# Patient Record
Sex: Female | Born: 1935 | Race: Black or African American | Hispanic: No | Smoking: Never smoker
Health system: Southern US, Community
[De-identification: ages and names within clinical notes are randomized; demographics above are authoritative.]

## PROBLEM LIST (undated history)

## (undated) DIAGNOSIS — I509 Heart failure, unspecified: Secondary | ICD-10-CM

## (undated) DIAGNOSIS — I10 Essential (primary) hypertension: Secondary | ICD-10-CM

## (undated) DIAGNOSIS — E079 Disorder of thyroid, unspecified: Secondary | ICD-10-CM

## (undated) DIAGNOSIS — I639 Cerebral infarction, unspecified: Secondary | ICD-10-CM

---

## 2017-10-07 ENCOUNTER — Emergency Department (HOSPITAL_COMMUNITY): Payer: Medicare Other

## 2017-10-07 ENCOUNTER — Emergency Department (HOSPITAL_COMMUNITY)
Admission: EM | Admit: 2017-10-07 | Discharge: 2017-10-07 | Disposition: A | Discharge status: Home / Self Care | Payer: Medicare Other | Attending: Physician Assistant | Admitting: Physician Assistant

## 2017-10-07 ENCOUNTER — Encounter (HOSPITAL_COMMUNITY): Payer: Self-pay | Admitting: Emergency Medicine

## 2017-10-07 DIAGNOSIS — I509 Heart failure, unspecified: Secondary | ICD-10-CM | POA: Diagnosis not present

## 2017-10-07 DIAGNOSIS — I110 Hypertensive heart disease with heart failure: Secondary | ICD-10-CM | POA: Diagnosis not present

## 2017-10-07 DIAGNOSIS — R404 Transient alteration of awareness: Secondary | ICD-10-CM | POA: Diagnosis not present

## 2017-10-07 DIAGNOSIS — R4182 Altered mental status, unspecified: Secondary | ICD-10-CM | POA: Diagnosis present

## 2017-10-07 HISTORY — DX: Heart failure, unspecified: I50.9

## 2017-10-07 HISTORY — DX: Disorder of thyroid, unspecified: E07.9

## 2017-10-07 HISTORY — DX: Essential (primary) hypertension: I10

## 2017-10-07 LAB — CBC WITH DIFFERENTIAL/PLATELET
Basophils Absolute: 0 10*3/uL (ref 0.0–0.1)
Basophils Relative: 0 %
EOS PCT: 5 %
Eosinophils Absolute: 0.3 10*3/uL (ref 0.0–0.7)
HEMATOCRIT: 37.6 % (ref 36.0–46.0)
HEMOGLOBIN: 11.5 g/dL — AB (ref 12.0–15.0)
LYMPHS ABS: 1.4 10*3/uL (ref 0.7–4.0)
LYMPHS PCT: 26 %
MCH: 27.7 pg (ref 26.0–34.0)
MCHC: 30.6 g/dL (ref 30.0–36.0)
MCV: 90.6 fL (ref 78.0–100.0)
Monocytes Absolute: 0.8 10*3/uL (ref 0.1–1.0)
Monocytes Relative: 16 %
Neutro Abs: 2.9 10*3/uL (ref 1.7–7.7)
Neutrophils Relative %: 53 %
Platelets: 293 10*3/uL (ref 150–400)
RBC: 4.15 MIL/uL (ref 3.87–5.11)
RDW: 16.2 % — ABNORMAL HIGH (ref 11.5–15.5)
WBC: 5.4 10*3/uL (ref 4.0–10.5)

## 2017-10-07 LAB — COMPREHENSIVE METABOLIC PANEL
ALBUMIN: 3 g/dL — AB (ref 3.5–5.0)
ALT: 7 U/L — AB (ref 14–54)
AST: 19 U/L (ref 15–41)
Alkaline Phosphatase: 35 U/L — ABNORMAL LOW (ref 38–126)
Anion gap: 13 (ref 5–15)
BUN: 14 mg/dL (ref 6–20)
CO2: 26 mmol/L (ref 22–32)
CREATININE: 0.92 mg/dL (ref 0.44–1.00)
Calcium: 8 mg/dL — ABNORMAL LOW (ref 8.9–10.3)
Chloride: 98 mmol/L — ABNORMAL LOW (ref 101–111)
GFR calc non Af Amer: 54 mL/min — ABNORMAL LOW (ref >60)
Glucose, Bld: 134 mg/dL — ABNORMAL HIGH (ref 65–99)
Potassium: 3.6 mmol/L (ref 3.5–5.1)
SODIUM: 137 mmol/L (ref 135–145)
Total Bilirubin: 0.6 mg/dL (ref 0.3–1.2)
Total Protein: 7.7 g/dL (ref 6.5–8.1)

## 2017-10-07 LAB — URINALYSIS, ROUTINE W REFLEX MICROSCOPIC
BILIRUBIN URINE: NEGATIVE
Glucose, UA: NEGATIVE mg/dL
Hgb urine dipstick: NEGATIVE
Ketones, ur: NEGATIVE mg/dL
Leukocytes, UA: NEGATIVE
NITRITE: NEGATIVE
PH: 6 (ref 5.0–8.0)
Protein, ur: NEGATIVE mg/dL
SPECIFIC GRAVITY, URINE: 1.008 (ref 1.005–1.030)

## 2017-10-07 LAB — AMMONIA: Ammonia: 21 umol/L (ref 9–35)

## 2017-10-07 NOTE — Discharge Instructions (Signed)
Get help right away if: °You develop severe headaches, repeated vomiting, seizures, blackouts, or slurred speech. °There is increasing confusion, weakness, numbness, restlessness, or personality changes. °You develop a loss of balance, have marked dizziness, feel uncoordinated, or fall. °You have delusions, hallucinations, or develop severe anxiety. °Your family members think you need to be rechecked. °

## 2017-10-07 NOTE — ED Provider Notes (Signed)
[facility] EMERGENCY DEPARTMENT Provider Note   CSN: [id number] Arrival date & time: 10/07/17  0906     History   Chief Complaint Chief Complaint  Patient presents with  . Altered Mental Status    HPI Tina Waters is a 84 y.o. female coming in from [facility] for AMS. Around 5:00 am patient was difficult to arouse for staff. She was thought to have a R sided facial droop. Fire found her 02 sat at 90%. Normal cbg and vitals otherwise.  EMS reports that she did seem to have a R facial droop, and required a sternal rub to awaken, however she awoke immediately and had an equal and symmetric facial muscle test. She has been awake and at baseline since she had a asternal rub. Oriented to Person and place (baseline MS). She has no complaints and no recent illnesses.  HPI  Past Medical History:  Diagnosis Date  . CHF (congestive heart failure) (HCC)   . Hypertension   . Thyroid disease     There are no active problems to display for this patient.   History reviewed. No pertinent surgical history.  OB History    No data available       Home Medications    Prior to Admission medications   Not on File    Family History History reviewed. No pertinent family history.  Social History Social History   Tobacco Use  . Smoking status: Never Smoker  . Smokeless tobacco: Never Used  Substance Use Topics  . Alcohol use: No    Frequency: Never  . Drug use: No     Allergies   Patient has no allergy information on record.   Review of Systems Review of Systems  Ten systems reviewed and are negative for acute change, except as noted in the HPI.   Physical Exam Updated Vital Signs BP 126/62   Pulse (!) 59   Temp 98.4 F (36.9 C) (Oral)   Resp 15   SpO2 93%   Physical Exam  Constitutional: She is oriented to person, place, and time. She appears well-developed and well-nourished. No distress.  HENT:  Head: Normocephalic and atraumatic.    Eyes: Conjunctivae are normal. No scleral icterus.  Neck: Normal range of motion.  Cardiovascular: Normal rate, regular rhythm and normal heart sounds. Exam reveals no gallop and no friction rub.  No murmur heard. Pulmonary/Chest: Effort normal and breath sounds normal. No respiratory distress.  Abdominal: Soft. Bowel sounds are normal. She exhibits no distension and no mass. There is no tenderness. There is no guarding.  Musculoskeletal: She exhibits edema.  BL lower extremities  Neurological: She is alert and oriented to person, place, and time. She displays normal reflexes. No cranial nerve deficit. She exhibits normal muscle tone. Coordination normal.  Speech is clear and goal oriented, follows commands Major Cranial nerves without deficit, no facial droop Normal strength in upper and lower extremities bilaterally including dorsiflexion and plantar flexion, strong and equal grip strength Sensation normal to light and sharp touch Moves extremities without ataxia, coordination intact Normal finger to nose and rapid alternating movements Neg romberg, no pronator drift            Skin: Skin is warm and dry. She is not diaphoretic.  Psychiatric: Her behavior is normal.  Nursing note and vitals reviewed.    ED Treatments / Results  Labs (all labs ordered are listed, but only abnormal results are displayed) Labs Reviewed  COMPREHENSIVE METABOLIC PANEL -  Abnormal; Notable for the following components:      Result Value   Chloride 98 (*)    Glucose, Bld 134 (*)    Calcium 8.0 (*)    Albumin 3.0 (*)    ALT 7 (*)    Alkaline Phosphatase 35 (*)    GFR calc non Af Amer 54 (*)    All other components within normal limits  CBC WITH DIFFERENTIAL/PLATELET - Abnormal; Notable for the following components:   Hemoglobin 11.5 (*)    RDW 16.2 (*)    All other components within normal limits  URINALYSIS, ROUTINE W REFLEX MICROSCOPIC  AMMONIA  CBC WITH DIFFERENTIAL/PLATELET  POC URINE  PREG, ED  CBG MONITORING, ED    EKG  EKG Interpretation  Date/Time:  Friday October 07 2017 10:21:00 EST Ventricular Rate:  60 PR Interval:    QRS Duration: 134 QT Interval:  461 QTC Calculation: 461 R Axis:   -73 Text Interpretation:  Age not entered, assumed to be  84 years old for purpose of ECG interpretation Atrial-paced complexes Nonspecific IVCD with LAD LVH with secondary repolarization abnormality Anterior Q waves, possibly due to LVH paced rhtyhm note. Confirmed by Bary Castilla ([id number]) on 10/07/2017 10:25:34 AM       Radiology Dg Chest 2 View  Result Date: 10/07/2017 CLINICAL DATA:  Altered mental status, CHF, hypertension EXAM: CHEST  2 VIEW COMPARISON:  None available FINDINGS: Marked cardiomegaly with mild central vascular congestion. Negative for CHF or significant edema. No large effusion or pneumothorax. Bibasilar atelectasis, worse on the left. Trachea is midline. No pneumothorax. Aorta is atherosclerotic. Degenerative changes of the spine and shoulders. Chronic deformity and postop changes of the right humeral head. IMPRESSION: Marked cardiomegaly with vascular congestion and basilar atelectasis. Negative for CHF Electronically Signed   By: Judie Petit.  Shick M.D.   On: 10/07/2017 10:06   Ct Head Wo Contrast  Result Date: 10/07/2017 CLINICAL DATA:  Altered level of consciousness EXAM: CT HEAD WITHOUT CONTRAST TECHNIQUE: Contiguous axial images were obtained from the base of the skull through the vertex without intravenous contrast. COMPARISON:  None. FINDINGS: Brain: No evidence of acute infarction, hemorrhage, extra-axial collection, ventriculomegaly, or mass effect. Generalized cerebral atrophy. Periventricular white matter low attenuation likely secondary to microangiopathy. Vascular: Cerebrovascular atherosclerotic calcifications are noted. Skull: Negative for fracture or focal lesion. Sinuses/Orbits: Visualized portions of the orbits are unremarkable. Mastoid sinuses are  clear. Right maxillary sinus mucous retention cyst. Mild bilateral maxillary sinus mucosal thickening. Right sphenoid sinus mucosal thickening. Other: None. IMPRESSION: 1. No acute intracranial pathology. Electronically Signed   By: Elige Ko   On: 10/07/2017 13:21    Procedures Procedures (including critical care time)  Medications Ordered in ED Medications - No data to display   Initial Impression / Assessment and Plan / ED Course  I have reviewed the triage vital signs and the nursing notes.  Pertinent labs & imaging results that were available during my care of the patient were reviewed by me and considered in my medical decision making (see chart for details).     I reviewed the patient's labs, EKG, chest x-ray and CT scan.  Her family member who is present states that she does tend  to sleep very heavily and has had this issue in the past.  No evidence of TIA or abnormal neurologic function.  Should not has been seen and shared visit with Dr. Corlis Leak.  She appears appropriate for discharge at this time.  Final Clinical Impressions(s) / ED  Diagnoses   Final diagnoses:  Transient alteration of awareness    ED Discharge Orders    None       Arthor Captain, PA-C 10/07/17 1620    Abelino Derrick, MD 10/12/17 [phone]

## 2017-10-07 NOTE — ED Notes (Signed)
Pt leaving department with [facility]. NAD.  

## 2017-10-07 NOTE — ED Triage Notes (Signed)
Pt to ER from [facility] - staff activated EMS for patient being unresponsive, EMS states patient required vigorous stimulation to wake up, was grimacing to pain and that was it, once woke up staff reported patient was at her baseline. EMS reports initially, patient had right sided facial droop, but once asked to smile the droop was no longer appreciable. Pt is alert and oriented to baseline. No neuro deficits on arrival.

## 2018-01-10 ENCOUNTER — Emergency Department (HOSPITAL_COMMUNITY): Payer: Medicare Other

## 2018-01-10 ENCOUNTER — Encounter (HOSPITAL_COMMUNITY): Payer: Self-pay

## 2018-01-10 ENCOUNTER — Emergency Department (HOSPITAL_COMMUNITY)
Admission: EM | Admit: 2018-01-10 | Discharge: 2018-01-11 | Disposition: A | Discharge status: Home / Self Care | Payer: Medicare Other | Attending: Emergency Medicine | Admitting: Emergency Medicine

## 2018-01-10 DIAGNOSIS — Z79899 Other long term (current) drug therapy: Secondary | ICD-10-CM | POA: Diagnosis not present

## 2018-01-10 DIAGNOSIS — R109 Unspecified abdominal pain: Secondary | ICD-10-CM | POA: Insufficient documentation

## 2018-01-10 DIAGNOSIS — R5383 Other fatigue: Secondary | ICD-10-CM | POA: Insufficient documentation

## 2018-01-10 DIAGNOSIS — I110 Hypertensive heart disease with heart failure: Secondary | ICD-10-CM | POA: Diagnosis not present

## 2018-01-10 DIAGNOSIS — Z7982 Long term (current) use of aspirin: Secondary | ICD-10-CM | POA: Insufficient documentation

## 2018-01-10 DIAGNOSIS — I509 Heart failure, unspecified: Secondary | ICD-10-CM | POA: Insufficient documentation

## 2018-01-10 DIAGNOSIS — R197 Diarrhea, unspecified: Secondary | ICD-10-CM | POA: Diagnosis not present

## 2018-01-10 LAB — COMPREHENSIVE METABOLIC PANEL
ALT: 7 U/L — ABNORMAL LOW (ref 14–54)
ANION GAP: 11 (ref 5–15)
AST: 17 U/L (ref 15–41)
Albumin: 3.6 g/dL (ref 3.5–5.0)
Alkaline Phosphatase: 32 U/L — ABNORMAL LOW (ref 38–126)
BILIRUBIN TOTAL: 0.7 mg/dL (ref 0.3–1.2)
BUN: 36 mg/dL — ABNORMAL HIGH (ref 6–20)
CHLORIDE: 101 mmol/L (ref 101–111)
CO2: 27 mmol/L (ref 22–32)
Calcium: 8.6 mg/dL — ABNORMAL LOW (ref 8.9–10.3)
Creatinine, Ser: 1 mg/dL (ref 0.44–1.00)
GFR calc Af Amer: 57 mL/min — ABNORMAL LOW (ref >60)
GFR calc non Af Amer: 49 mL/min — ABNORMAL LOW (ref >60)
GLUCOSE: 109 mg/dL — AB (ref 65–99)
POTASSIUM: 4.3 mmol/L (ref 3.5–5.1)
Sodium: 139 mmol/L (ref 135–145)
TOTAL PROTEIN: 8.2 g/dL — AB (ref 6.5–8.1)

## 2018-01-10 LAB — TROPONIN I
Troponin I: 0.03 ng/mL (ref <0.03)
Troponin I: 0.03 ng/mL (ref <0.03)

## 2018-01-10 LAB — URINALYSIS, ROUTINE W REFLEX MICROSCOPIC
Bilirubin Urine: NEGATIVE
Glucose, UA: NEGATIVE mg/dL
Hgb urine dipstick: NEGATIVE
Ketones, ur: NEGATIVE mg/dL
LEUKOCYTES UA: NEGATIVE
Nitrite: NEGATIVE
PH: 6 (ref 5.0–8.0)
Protein, ur: NEGATIVE mg/dL
SPECIFIC GRAVITY, URINE: 1.01 (ref 1.005–1.030)

## 2018-01-10 LAB — CBC WITH DIFFERENTIAL/PLATELET
Basophils Absolute: 0 10*3/uL (ref 0.0–0.1)
Basophils Relative: 0 %
Eosinophils Absolute: 0.2 10*3/uL (ref 0.0–0.7)
Eosinophils Relative: 3 %
HEMATOCRIT: 36.1 % (ref 36.0–46.0)
Hemoglobin: 11.2 g/dL — ABNORMAL LOW (ref 12.0–15.0)
LYMPHS ABS: 2.6 10*3/uL (ref 0.7–4.0)
LYMPHS PCT: 43 %
MCH: 28 pg (ref 26.0–34.0)
MCHC: 31 g/dL (ref 30.0–36.0)
MCV: 90.3 fL (ref 78.0–100.0)
Monocytes Absolute: 0.6 10*3/uL (ref 0.1–1.0)
Monocytes Relative: 9 %
NEUTROS ABS: 2.7 10*3/uL (ref 1.7–7.7)
Neutrophils Relative %: 45 %
Platelets: 288 10*3/uL (ref 150–400)
RBC: 4 MIL/uL (ref 3.87–5.11)
RDW: 16.9 % — ABNORMAL HIGH (ref 11.5–15.5)
WBC: 6.1 10*3/uL (ref 4.0–10.5)

## 2018-01-10 LAB — CBG MONITORING, ED: Glucose-Capillary: 99 mg/dL (ref 65–99)

## 2018-01-10 NOTE — ED Provider Notes (Signed)
[facility]-EMERGENCY DEPT Provider Note   CSN: [id number] Arrival date & time: 01/10/18  1750     History   Chief Complaint Chief Complaint  Patient presents with  . Fatigue    HPI Tina Waters is a 84 y.o. female.  HPI  84 year old female presents requesting a "checkup".  She states that she has been feeling weak and fatigued for a couple weeks and thought she needed to get checked out.  She states she had diarrhea and some abdominal pain last week but that has resolved.  She had a little bit of dysuria but thinks that is also gone.  Besides feeling overall weak, she denies any new or other symptoms.  She denies headache, chest pain, shortness of breath.  Past Medical History:  Diagnosis Date  . CHF (congestive heart failure) (HCC)   . Hypertension   . Thyroid disease     There are no active problems to display for this patient.   History reviewed. No pertinent surgical history.   OB History   None      Home Medications    Prior to Admission medications   Medication Sig Start Date End Date Taking? Authorizing Provider  acetaminophen (TYLENOL) 500 MG tablet Take 500 mg by mouth every 8 (eight) hours as needed for moderate pain.   Yes [provider]  aspirin EC 81 MG tablet Take 81 mg by mouth daily.   Yes [provider]  atorvastatin (LIPITOR) 20 MG tablet Take 20 mg by mouth at bedtime.   Yes [provider]  carvedilol (COREG) 12.5 MG tablet Take 12.5 mg by mouth 2 (two) times daily.   Yes [provider]  Cholecalciferol (VITAMIN D3) 5000 units TABS Take 5,000 Units by mouth every 30 (thirty) days.   Yes [provider]  cinacalcet (SENSIPAR) 30 MG tablet Take 30 mg by mouth 2 (two) times daily.   Yes [provider]  colchicine 0.6 MG tablet Take 0.6 mg by mouth every 12 (twelve) hours.   Yes [provider]  cyclobenzaprine (FLEXERIL) 5 MG tablet Take 5 mg by mouth every 6 (six)  hours as needed (shoulder pain).   Yes [provider]  furosemide (LASIX) 40 MG tablet Take 40 mg by mouth 2 (two) times daily.   Yes [provider]  gabapentin (NEURONTIN) 300 MG capsule Take 300 mg by mouth 2 (two) times daily.   Yes [provider]  gabapentin (NEURONTIN) 400 MG capsule Take 400 mg by mouth at bedtime.   Yes [provider]  Infant Care Products (DERMACLOUD) CREA Apply 1 application topically 3 (three) times daily. Apply to sacrum and buttocks topically every shift to protect skin from breakdown   Yes [provider]  insulin aspart (NOVOLOG) 100 UNIT/ML injection Inject 2-10 Units into the skin 4 (four) times daily - after meals and at bedtime. Per sliding scale if BS 151-200 = 2 units, 201-250 = 4 units, 251-300 = 6 units, 301-350 = 8 units, 351-400 = 10 units   Yes [provider]  isosorbide mononitrate (IMDUR) 30 MG 24 hr tablet Take 30 mg by mouth daily.   Yes [provider]  levothyroxine (SYNTHROID, LEVOTHROID) 25 MCG tablet Take 12.5 mcg by mouth daily.   Yes [provider]  losartan (COZAAR) 100 MG tablet Take 100 mg by mouth daily.   Yes [provider]  metFORMIN (GLUCOPHAGE) 500 MG tablet Take 500 mg by mouth 2 (two) times  daily.   Yes [provider]  nitroGLYCERIN (NITROSTAT) 0.4 MG SL tablet Place 0.4 mg under the tongue every 5 (five) minutes as needed for chest pain. For up to 3 doses. If no relief, call MD   Yes [provider]  spironolactone (ALDACTONE) 25 MG tablet Take 12.5 mg by mouth 2 (two) times daily.   Yes [provider]  tiZANidine (ZANAFLEX) 2 MG tablet Take 2 mg by mouth every 8 (eight) hours as needed (shoulder pain).   Yes [provider]  venlafaxine XR (EFFEXOR-XR) 75 MG 24 hr capsule Take 75 mg by mouth daily with breakfast.   Yes [provider]    Family History History reviewed. No pertinent family  history.  Social History Social History   Tobacco Use  . Smoking status: Never Smoker  . Smokeless tobacco: Never Used  Substance Use Topics  . Alcohol use: No    Frequency: Never  . Drug use: No     Allergies   Penicillins   Review of Systems Review of Systems  Constitutional: Positive for fatigue. Negative for fever.  Respiratory: Negative for shortness of breath.   Cardiovascular: Negative for chest pain.  Gastrointestinal: Negative for abdominal pain, diarrhea and vomiting.  Genitourinary: Negative for dysuria.  Neurological: Negative for weakness, numbness and headaches.  All other systems reviewed and are negative.    Physical Exam Updated Vital Signs BP (!) 109/59   Pulse (!) 59   Temp 98.3 F (36.8 C) (Oral)   Resp 18   SpO2 94%   Physical Exam  Constitutional: She appears well-developed and well-nourished. No distress.  HENT:  Head: Normocephalic and atraumatic.  Right Ear: External ear normal.  Left Ear: External ear normal.  Nose: Nose normal.  Eyes: Right eye exhibits no discharge. Left eye exhibits no discharge.  Cardiovascular: Normal rate, regular rhythm and normal heart sounds.  Pulmonary/Chest: Effort normal and breath sounds normal.  Abdominal: Soft. There is no tenderness.  Neurological: She is alert.  Awake, alert, oriented to person, place, day of week, month and can't quite remember year. Knows the president. CN 3-12 grossly intact. 5/5 strength in all 4 extremities. Grossly normal sensation. Normal finger to nose.   Skin: Skin is warm and dry. She is not diaphoretic.  Nursing note and vitals reviewed.    ED Treatments / Results  Labs (all labs ordered are listed, but only abnormal results are displayed) Labs Reviewed  COMPREHENSIVE METABOLIC PANEL - Abnormal; Notable for the following components:      Result Value   Glucose, Bld 109 (*)    BUN 36 (*)    Calcium 8.6 (*)    Total Protein 8.2 (*)    ALT 7 (*)    Alkaline  Phosphatase 32 (*)    GFR calc non Af Amer 49 (*)    GFR calc Af Amer 57 (*)    All other components within normal limits  CBC WITH DIFFERENTIAL/PLATELET - Abnormal; Notable for the following components:   Hemoglobin 11.2 (*)    RDW 16.9 (*)    All other components within normal limits  TROPONIN I - Abnormal; Notable for the following components:   Troponin I 0.03 (*)    All other components within normal limits  TROPONIN I - Abnormal; Notable for the following components:   Troponin I 0.03 (*)    All other components within normal limits  URINALYSIS, ROUTINE W REFLEX MICROSCOPIC  CBG MONITORING, ED    EKG  EKG Interpretation  Date/Time:  Tuesday January 10 2018 19:04:32 EDT Ventricular Rate:  60 PR Interval:    QRS Duration: 137 QT Interval:  463 QTC Calculation: 463 R Axis:   -70 Text Interpretation:  Atrial-paced rhythm RBBB and LAFB Probable left ventricular hypertrophy Nonspecific T abnormalities, lateral leads no significant change since Mar 2019 Confirmed by Pricilla Loveless [phone]) on 01/10/2018 7:49:38 PM   Radiology Dg Chest 2 View  Result Date: 01/10/2018 CLINICAL DATA:  Weakness and diarrhea for 2 weeks. EXAM: CHEST - 2 VIEW COMPARISON:  None. FINDINGS: Moderate cardiomegaly. Dual lead transvenous pacemaker. Opacity is seen in the medial left lung base, suspicious for hiatal hernia, however mass, pneumonia, or thoracic aortic aneurysm cannot definitely be excluded. No evidence of pleural effusion. IMPRESSION: Opacity in medial left lung base is suspicious for hiatal hernia, although mass pneumonia, or thoracic aortic aneurysm cannot definitely be excluded. Recommend chest CT without contrast for further evaluation. Electronically Signed   By: Myles Rosenthal M.D.   On: 01/10/2018 20:35   Ct Chest Wo Contrast  Result Date: 01/10/2018 CLINICAL DATA:  Medial left lung base opacity. EXAM: CT CHEST WITHOUT CONTRAST TECHNIQUE: Multidetector CT imaging of the chest was performed  following the standard protocol without IV contrast. COMPARISON:  Chest x-ray from same day. FINDINGS: Cardiovascular: Moderate cardiomegaly. Left chest wall pacemaker in place with leads terminating in the right atrium and right ventricle. No pericardial effusion. Normal caliber thoracic aorta. Coronary, aortic arch, and branch vessel atherosclerotic vascular disease. Mediastinum/Nodes: No enlarged mediastinal or axillary lymph nodes. Thyroid gland, trachea, and esophagus demonstrate no significant findings. Large hiatal hernia. Lungs/Pleura: Bibasilar atelectasis/scarring. No focal consolidation, pleural effusion, or pneumothorax. No suspicious pulmonary nodule. Upper Abdomen: No acute abnormality.  Cholelithiasis. Musculoskeletal: No chest wall abnormality. No acute or significant osseous findings. Multiple old left anterior rib fractures. Deformities of both humeral heads with glenohumeral joint space narrowing. 1.4 x 1.0 x 1.6 cm (AP by transverse by CC) calcified lesion within the left aspect of the spinal canal at the level of T12-L1. IMPRESSION: 1. No acute intrathoracic process. The medial left lung base opacity seen on x-ray corresponds to a large hiatal hernia. 2. Moderate cardiomegaly. 3. 1.6 cm calcified lesion within the left aspect of the spinal canal at the level of T12-L1, likely a meningioma. 4.  Aortic atherosclerosis (ICD10-I70.0). Electronically Signed   By: Obie Dredge M.D.   On: 01/10/2018 22:18    Procedures Procedures (including critical care time)  Medications Ordered in ED Medications - No data to display   Initial Impression / Assessment and Plan / ED Course  I have reviewed the triage vital signs and the nursing notes.  Pertinent labs & imaging results that were available during my care of the patient were reviewed by me and considered in my medical decision making (see chart for details).     Patient has nonspecific fatigue for a couple weeks.  Could be related to  her recent diarrheal illness.  Her lab work is relatively reassuring with mild hypocalcemia and otherwise benign blood work.  However she is noted to have a low level troponin of 0.03.  Given her history of CHF with cardiomegaly this is probably the cause.  Thus a repeat troponin was obtained and this is also 0.03.  Thus this is not consistent with MI.  I doubt this is the cause of her symptoms.  She has no focal deficits to be concern for stroke.  Her vital signs are unremarkable.  She appears stable for discharge home to follow-up with PCP for nonspecific fatigue.  Final Clinical Impressions(s) / ED Diagnoses   Final diagnoses:  Fatigue, unspecified type    ED Discharge Orders    None       Pricilla Loveless, MD 01/10/18 2352

## 2018-01-10 NOTE — ED Notes (Signed)
Bed: WA23 Expected date:  Expected time:  Means of arrival:  Comments: EMS-weakness 

## 2018-01-10 NOTE — ED Notes (Signed)
Patient transported to CT 

## 2018-01-10 NOTE — ED Triage Notes (Signed)
Patient arrived via [facility] from [facility]. Patient c/o of not feeling well for 2 weeks. Patient has had diarrhea for about a week. Denies weakness and shob. Hx. Of dementia. Mentating normally. Hx. CHF, DM.

## 2018-01-11 NOTE — ED Notes (Signed)
[facility] was called to take pt back to [facility]

## 2018-04-08 ENCOUNTER — Emergency Department (HOSPITAL_COMMUNITY): Payer: Medicare Other

## 2018-04-08 ENCOUNTER — Inpatient Hospital Stay (HOSPITAL_COMMUNITY)
Admission: EM | Admit: 2018-04-08 | Discharge: 2018-04-15 | DRG: 291 | Disposition: A | Discharge status: Skilled Nursing Facility | Payer: Medicare Other | Attending: Internal Medicine | Admitting: Internal Medicine

## 2018-04-08 ENCOUNTER — Encounter (HOSPITAL_COMMUNITY): Payer: Self-pay | Admitting: Emergency Medicine

## 2018-04-08 DIAGNOSIS — I130 Hypertensive heart and chronic kidney disease with heart failure and stage 1 through stage 4 chronic kidney disease, or unspecified chronic kidney disease: Secondary | ICD-10-CM | POA: Diagnosis not present

## 2018-04-08 DIAGNOSIS — E1122 Type 2 diabetes mellitus with diabetic chronic kidney disease: Secondary | ICD-10-CM | POA: Diagnosis present

## 2018-04-08 DIAGNOSIS — R471 Dysarthria and anarthria: Secondary | ICD-10-CM | POA: Diagnosis present

## 2018-04-08 DIAGNOSIS — R0902 Hypoxemia: Secondary | ICD-10-CM | POA: Diagnosis not present

## 2018-04-08 DIAGNOSIS — Z880 Allergy status to penicillin: Secondary | ICD-10-CM

## 2018-04-08 DIAGNOSIS — G9340 Encephalopathy, unspecified: Secondary | ICD-10-CM | POA: Diagnosis present

## 2018-04-08 DIAGNOSIS — I69354 Hemiplegia and hemiparesis following cerebral infarction affecting left non-dominant side: Secondary | ICD-10-CM

## 2018-04-08 DIAGNOSIS — R05 Cough: Secondary | ICD-10-CM

## 2018-04-08 DIAGNOSIS — Z993 Dependence on wheelchair: Secondary | ICD-10-CM

## 2018-04-08 DIAGNOSIS — Z23 Encounter for immunization: Secondary | ICD-10-CM

## 2018-04-08 DIAGNOSIS — J189 Pneumonia, unspecified organism: Secondary | ICD-10-CM | POA: Diagnosis present

## 2018-04-08 DIAGNOSIS — I5043 Acute on chronic combined systolic (congestive) and diastolic (congestive) heart failure: Secondary | ICD-10-CM | POA: Diagnosis present

## 2018-04-08 DIAGNOSIS — N179 Acute kidney failure, unspecified: Secondary | ICD-10-CM | POA: Diagnosis present

## 2018-04-08 DIAGNOSIS — R0602 Shortness of breath: Secondary | ICD-10-CM

## 2018-04-08 DIAGNOSIS — Z79890 Hormone replacement therapy (postmenopausal): Secondary | ICD-10-CM

## 2018-04-08 DIAGNOSIS — E860 Dehydration: Secondary | ICD-10-CM | POA: Diagnosis present

## 2018-04-08 DIAGNOSIS — Z7982 Long term (current) use of aspirin: Secondary | ICD-10-CM

## 2018-04-08 DIAGNOSIS — E119 Type 2 diabetes mellitus without complications: Secondary | ICD-10-CM

## 2018-04-08 DIAGNOSIS — I471 Supraventricular tachycardia: Secondary | ICD-10-CM | POA: Diagnosis not present

## 2018-04-08 DIAGNOSIS — E039 Hypothyroidism, unspecified: Secondary | ICD-10-CM | POA: Diagnosis present

## 2018-04-08 DIAGNOSIS — Z950 Presence of cardiac pacemaker: Secondary | ICD-10-CM

## 2018-04-08 DIAGNOSIS — R479 Unspecified speech disturbances: Secondary | ICD-10-CM | POA: Diagnosis present

## 2018-04-08 DIAGNOSIS — Z79899 Other long term (current) drug therapy: Secondary | ICD-10-CM

## 2018-04-08 DIAGNOSIS — R059 Cough, unspecified: Secondary | ICD-10-CM

## 2018-04-08 DIAGNOSIS — I6930 Unspecified sequelae of cerebral infarction: Secondary | ICD-10-CM

## 2018-04-08 DIAGNOSIS — E669 Obesity, unspecified: Secondary | ICD-10-CM | POA: Diagnosis present

## 2018-04-08 DIAGNOSIS — R778 Other specified abnormalities of plasma proteins: Secondary | ICD-10-CM | POA: Diagnosis present

## 2018-04-08 DIAGNOSIS — Z6834 Body mass index (BMI) 34.0-34.9, adult: Secondary | ICD-10-CM

## 2018-04-08 DIAGNOSIS — E875 Hyperkalemia: Secondary | ICD-10-CM | POA: Diagnosis present

## 2018-04-08 DIAGNOSIS — D638 Anemia in other chronic diseases classified elsewhere: Secondary | ICD-10-CM | POA: Diagnosis present

## 2018-04-08 DIAGNOSIS — G9349 Other encephalopathy: Secondary | ICD-10-CM | POA: Diagnosis present

## 2018-04-08 DIAGNOSIS — R29898 Other symptoms and signs involving the musculoskeletal system: Secondary | ICD-10-CM

## 2018-04-08 DIAGNOSIS — N183 Chronic kidney disease, stage 3 (moderate): Secondary | ICD-10-CM | POA: Diagnosis present

## 2018-04-08 DIAGNOSIS — Z794 Long term (current) use of insulin: Secondary | ICD-10-CM

## 2018-04-08 DIAGNOSIS — I361 Nonrheumatic tricuspid (valve) insufficiency: Secondary | ICD-10-CM | POA: Diagnosis present

## 2018-04-08 DIAGNOSIS — R7989 Other specified abnormal findings of blood chemistry: Secondary | ICD-10-CM | POA: Diagnosis present

## 2018-04-08 DIAGNOSIS — R4182 Altered mental status, unspecified: Secondary | ICD-10-CM

## 2018-04-08 DIAGNOSIS — I428 Other cardiomyopathies: Secondary | ICD-10-CM | POA: Diagnosis present

## 2018-04-08 HISTORY — DX: Cerebral infarction, unspecified: I63.9

## 2018-04-08 LAB — CBC WITH DIFFERENTIAL/PLATELET
ABS IMMATURE GRANULOCYTES: 0.1 10*3/uL (ref 0.0–0.1)
BASOS ABS: 0.1 10*3/uL (ref 0.0–0.1)
Basophils Relative: 1 %
EOS ABS: 0.1 10*3/uL (ref 0.0–0.7)
Eosinophils Relative: 1 %
HCT: 39 % (ref 36.0–46.0)
Hemoglobin: 11.9 g/dL — ABNORMAL LOW (ref 12.0–15.0)
IMMATURE GRANULOCYTES: 1 %
Lymphocytes Relative: 20 %
Lymphs Abs: 1.5 10*3/uL (ref 0.7–4.0)
MCH: 28.6 pg (ref 26.0–34.0)
MCHC: 30.5 g/dL (ref 30.0–36.0)
MCV: 93.8 fL (ref 78.0–100.0)
Monocytes Absolute: 0.8 10*3/uL (ref 0.1–1.0)
Monocytes Relative: 10 %
NEUTROS ABS: 5.1 10*3/uL (ref 1.7–7.7)
NEUTROS PCT: 67 %
PLATELETS: 269 10*3/uL (ref 150–400)
RBC: 4.16 MIL/uL (ref 3.87–5.11)
RDW: 16.1 % — AB (ref 11.5–15.5)
WBC: 7.6 10*3/uL (ref 4.0–10.5)

## 2018-04-08 LAB — I-STAT TROPONIN, ED: Troponin i, poc: 0.12 ng/mL (ref 0.00–0.08)

## 2018-04-08 LAB — URINALYSIS, ROUTINE W REFLEX MICROSCOPIC
BILIRUBIN URINE: NEGATIVE
Glucose, UA: NEGATIVE mg/dL
Hgb urine dipstick: NEGATIVE
KETONES UR: NEGATIVE mg/dL
Leukocytes, UA: NEGATIVE
Nitrite: NEGATIVE
PROTEIN: 30 mg/dL — AB
Specific Gravity, Urine: 1.017 (ref 1.005–1.030)
pH: 5 (ref 5.0–8.0)

## 2018-04-08 LAB — I-STAT CG4 LACTIC ACID, ED
LACTIC ACID, VENOUS: 3.2 mmol/L — AB (ref 0.5–1.9)
Lactic Acid, Venous: 2.94 mmol/L (ref 0.5–1.9)

## 2018-04-08 LAB — COMPREHENSIVE METABOLIC PANEL
ALT: 8 U/L (ref 0–44)
ANION GAP: 11 (ref 5–15)
AST: 21 U/L (ref 15–41)
Albumin: 3.4 g/dL — ABNORMAL LOW (ref 3.5–5.0)
Alkaline Phosphatase: 34 U/L — ABNORMAL LOW (ref 38–126)
BUN: 34 mg/dL — AB (ref 8–23)
CHLORIDE: 101 mmol/L (ref 98–111)
CO2: 26 mmol/L (ref 22–32)
Calcium: 7.4 mg/dL — ABNORMAL LOW (ref 8.9–10.3)
Creatinine, Ser: 1.49 mg/dL — ABNORMAL HIGH (ref 0.44–1.00)
GFR, EST AFRICAN AMERICAN: 35 mL/min — AB (ref >60)
GFR, EST NON AFRICAN AMERICAN: 30 mL/min — AB (ref >60)
Glucose, Bld: 149 mg/dL — ABNORMAL HIGH (ref 70–99)
Potassium: 5.9 mmol/L — ABNORMAL HIGH (ref 3.5–5.1)
SODIUM: 138 mmol/L (ref 135–145)
Total Bilirubin: 0.6 mg/dL (ref 0.3–1.2)
Total Protein: 7.4 g/dL (ref 6.5–8.1)

## 2018-04-08 LAB — PROTIME-INR
INR: 1.35
PROTHROMBIN TIME: 16.6 s — AB (ref 11.4–15.2)

## 2018-04-08 MED ORDER — LEVOFLOXACIN IN D5W 750 MG/150ML IV SOLN
750.0000 mg | Freq: Once | INTRAVENOUS | Status: AC
  Administered 2018-04-08: 750 mg via INTRAVENOUS
  Filled 2018-04-08: qty 150

## 2018-04-08 NOTE — ED Notes (Signed)
I Stat Lac Acid results of 3.20 reported to Dr. Salena Saner. [place].

## 2018-04-08 NOTE — ED Triage Notes (Signed)
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 At baseline pt able to care for herself with assistance. At this time, pt answering questions appropriately.

## 2018-04-08 NOTE — ED Notes (Signed)
Pt returned from xray and immediately went to CT via stretcher, family updated

## 2018-04-08 NOTE — ED Notes (Signed)
I Stat Trop I results 0.12 reported to Dr. Salena Saner. [place].

## 2018-04-08 NOTE — ED Notes (Signed)
IV attempts by 2 different nurses and EMS, IV team consulted for IV start and lab/BC draw

## 2018-04-08 NOTE — ED Notes (Signed)
IV team at bedside attempting access and lab draw

## 2018-04-08 NOTE — ED Notes (Signed)
I Stat Lac Acid results of 2.94 reported to Dr. Salena Saner. [place]

## 2018-04-08 NOTE — ED Provider Notes (Signed)
[facility] EMERGENCY DEPARTMENT Provider Note   CSN: [id number] Arrival date & time: 04/08/18  1958     History   Chief Complaint Chief Complaint  Patient presents with  . Altered Mental Status    HPI Tina Waters is a 84 y.o. female.  The history is provided by the patient and medical records. No language interpreter was used.  Altered Mental Status   This is a new problem. The current episode started more than 2 days ago. The problem has not changed since onset.Associated symptoms include confusion and weakness. Pertinent negatives include no agitation. Her past medical history is significant for hypertension. Her past medical history does not include seizures or CVA.    Past Medical History:  Diagnosis Date  . CHF (congestive heart failure) (HCC)   . Hypertension   . Thyroid disease     There are no active problems to display for this patient.   History reviewed. No pertinent surgical history.   OB History   None      Home Medications    Prior to Admission medications   Medication Sig Start Date End Date Taking? Authorizing Provider  acetaminophen (TYLENOL) 500 MG tablet Take 500 mg by mouth every 8 (eight) hours as needed for moderate pain.    [provider]  aspirin EC 81 MG tablet Take 81 mg by mouth daily.    [provider]  atorvastatin (LIPITOR) 20 MG tablet Take 20 mg by mouth at bedtime.    [provider]  carvedilol (COREG) 12.5 MG tablet Take 12.5 mg by mouth 2 (two) times daily.    [provider]  Cholecalciferol (VITAMIN D3) 5000 units TABS Take 5,000 Units by mouth every 30 (thirty) days.    [provider]  cinacalcet (SENSIPAR) 30 MG tablet Take 30 mg by mouth 2 (two) times daily.    [provider]  colchicine 0.6 MG tablet Take 0.6 mg by mouth every 12 (twelve) hours.    [provider]  cyclobenzaprine (FLEXERIL) 5 MG tablet Take 5 mg by mouth every 6 (six)  hours as needed (shoulder pain).    [provider]  furosemide (LASIX) 40 MG tablet Take 40 mg by mouth 2 (two) times daily.    [provider]  gabapentin (NEURONTIN) 300 MG capsule Take 300 mg by mouth 2 (two) times daily.    [provider]  gabapentin (NEURONTIN) 400 MG capsule Take 400 mg by mouth at bedtime.    [provider]  Infant Care Products (DERMACLOUD) CREA Apply 1 application topically 3 (three) times daily. Apply to sacrum and buttocks topically every shift to protect skin from breakdown    [provider]  insulin aspart (NOVOLOG) 100 UNIT/ML injection Inject 2-10 Units into the skin 4 (four) times daily - after meals and at bedtime. Per sliding scale if BS 151-200 = 2 units, 201-250 = 4 units, 251-300 = 6 units, 301-350 = 8 units, 351-400 = 10 units    [provider]  isosorbide mononitrate (IMDUR) 30 MG 24 hr tablet Take 30 mg by mouth daily.    [provider]  levothyroxine (SYNTHROID, LEVOTHROID) 25 MCG tablet Take 12.5 mcg by mouth daily.    [provider]  losartan (COZAAR) 100 MG tablet Take 100 mg by mouth daily.    [provider]  metFORMIN (GLUCOPHAGE) 500 MG tablet Take 500 mg by mouth 2 (two) times daily.    [provider]  nitroGLYCERIN (NITROSTAT) 0.4 MG SL tablet Place 0.4 mg under the tongue every 5 (five) minutes as needed for chest pain. For up to 3 doses. If no relief, call MD    [provider]  spironolactone (ALDACTONE) 25 MG tablet Take 12.5 mg by mouth 2 (two) times daily.    [provider]  tiZANidine (ZANAFLEX) 2 MG tablet Take 2 mg by mouth every 8 (eight) hours as needed (shoulder pain).    [provider]  venlafaxine XR (EFFEXOR-XR) 75 MG 24 hr capsule Take 75 mg by mouth daily with breakfast.    [provider]    Family History No family history on file.  Social History Social History   Tobacco Use  . Smoking  status: Never Smoker  . Smokeless tobacco: Never Used  Substance Use Topics  . Alcohol use: No    Frequency: Never  . Drug use: No     Allergies   Penicillins   Review of Systems Review of Systems  Constitutional: Positive for chills and fatigue. Negative for diaphoresis and fever.  HENT: Negative for congestion.   Respiratory: Positive for cough and shortness of breath. Negative for chest tightness and wheezing.   Cardiovascular: Negative for chest pain, palpitations and leg swelling.  Gastrointestinal: Negative for constipation, diarrhea, nausea and vomiting.  Genitourinary: Positive for decreased urine volume. Negative for flank pain and frequency.  Musculoskeletal: Negative for back pain, neck pain and neck stiffness.  Skin: Negative for rash and wound.  Neurological: Positive for speech difficulty (resolbved) and weakness. Negative for light-headedness, numbness and headaches.  Psychiatric/Behavioral: Positive for confusion. Negative for agitation.  All other systems reviewed and are negative.    Physical Exam Updated Vital Signs BP (!) 145/115 (BP Location: Right Arm)   Pulse (!) 59   Resp (!) 22   SpO2 97%   Physical Exam  Constitutional: She appears well-developed and well-nourished. No distress.  HENT:  Head: Normocephalic and atraumatic.  Mouth/Throat: Oropharynx is clear and moist. No oropharyngeal exudate.  Eyes: Pupils are equal, round, and reactive to light. Conjunctivae and EOM are normal.  Neck: Normal range of motion. Neck supple.  Cardiovascular: Normal rate and regular rhythm.  No murmur heard. Pulmonary/Chest: Tachypnea noted. No respiratory distress. She has no wheezes. She has rhonchi. She exhibits no tenderness.  Abdominal: Soft. She exhibits no distension. There is no tenderness. There is no guarding.  Musculoskeletal: She exhibits no edema or tenderness.  Neurological: She is alert. No sensory deficit. She exhibits abnormal muscle tone.  Skin:  Skin is warm and dry. No rash noted. She is not diaphoretic. No erythema.  Psychiatric: She has a normal mood and affect.  Nursing note and vitals reviewed.    ED Treatments / Results  Labs (all labs ordered are listed, but only abnormal results are displayed) Labs Reviewed  COMPREHENSIVE METABOLIC PANEL - Abnormal; Notable for the following components:      Result Value   Potassium 5.9 (*)    Glucose, Bld 149 (*)    BUN 34 (*)    Creatinine, Ser 1.49 (*)    Calcium 7.4 (*)    Albumin 3.4 (*)    Alkaline Phosphatase 34 (*)    GFR calc non Af Amer 30 (*)    GFR calc Af Amer 35 (*)    All other components within normal limits  CBC WITH DIFFERENTIAL/PLATELET - Abnormal; Notable for the following components:   Hemoglobin 11.9 (*)    RDW 16.1 (*)  All other components within normal limits  PROTIME-INR - Abnormal; Notable for the following components:   Prothrombin Time 16.6 (*)    All other components within normal limits  URINALYSIS, ROUTINE W REFLEX MICROSCOPIC - Abnormal; Notable for the following components:   APPearance HAZY (*)    Protein, ur 30 (*)    Bacteria, UA RARE (*)    All other components within normal limits  I-STAT CG4 LACTIC ACID, ED - Abnormal; Notable for the following components:   Lactic Acid, Venous 3.20 (*)    All other components within normal limits  I-STAT TROPONIN, ED - Abnormal; Notable for the following components:   Troponin i, poc 0.12 (*)    All other components within normal limits  I-STAT CG4 LACTIC ACID, ED - Abnormal; Notable for the following components:   Lactic Acid, Venous 2.94 (*)    All other components within normal limits  CULTURE, BLOOD (ROUTINE X 2)  CULTURE, BLOOD (ROUTINE X 2)  URINE CULTURE  BRAIN NATRIURETIC PEPTIDE  TSH    EKG EKG Interpretation  Date/Time:  Saturday April 08 2018 20:10:50 EDT Ventricular Rate:  60 PR Interval:    QRS Duration: 139 QT Interval:  490 QTC Calculation: 490 R Axis:   -79 Text  Interpretation:  Sinus rhythm Probable left atrial enlargement Nonspecific IVCD with LAD LVH with secondary repolarization abnormality Anterior Q waves, possibly due to LVH When compared to prior, new t wave inversionin lead V6.  No STEMI Confirmed by Theda Belfast ([id number]) on 04/08/2018 9:31:08 PM   Radiology Dg Chest 2 View  Result Date: 04/08/2018 CLINICAL DATA:  Pt BIB [facility] from [facility], staff noted pt has had decreased LOC since Thursday, hx dementia, CVA with deficits on the left. At baseline pt able to care for herself with assistance. At this time, pt answering questions appropriately EXAM: CHEST - 2 VIEW COMPARISON:  none FINDINGS: Low lung volumes. Suspect central pulmonary vascular congestion. Consolidation/atelectasis in the lung bases, left greater than right. Cardiomegaly. Left subclavian transvenous pacemaker. Aortic Atherosclerosis (ICD10-170.0). Moderate left and smaller right pleural effusions.  No pneumothorax. DJD in the left shoulder.  Hiatal hernia. IMPRESSION: 1. Cardiomegaly, pleural effusions, and central pulmonary vascularity suggesting CHF. 2. Bibasilar consolidation/atelectasis, left greater than right. Electronically Signed   By: Corlis Leak M.D.   On: 04/08/2018 22:07   Ct Head Wo Contrast  Result Date: 04/08/2018 CLINICAL DATA:  Patient with decreased level of consciousness. EXAM: CT HEAD WITHOUT CONTRAST TECHNIQUE: Contiguous axial images were obtained from the base of the skull through the vertex without intravenous contrast. COMPARISON:  None. FINDINGS: Brain: Patchy hypodensity within the left basal ganglia (image 15; series 3). Ventricles and sulci are prominent compatible with atrophy. No evidence for intracranial hemorrhage, mass lesion or mass-effect. Vascular: Internal carotid arterial vascular calcifications. Skull: Intact. Sinuses/Orbits: Paranasal sinuses are well aerated. Mastoid air cells are unremarkable. Orbits unremarkable. Other: None.  IMPRESSION: 1. Hypodensity within the left basal ganglia favored to represent subacute to chronic left basal ganglia infarct. Electronically Signed   By: Annia Belt M.D.   On: 04/08/2018 22:40    Procedures Procedures (including critical care time)  Medications Ordered in ED Medications  levofloxacin (LEVAQUIN) IVPB 750 mg (750 mg Intravenous New Bag/Given 04/08/18 2318)     Initial Impression / Assessment and Plan / ED Course  I have reviewed the triage vital signs and the nursing notes.  Pertinent labs & imaging results that were available during my care of  the patient were reviewed by me and considered in my medical decision making (see chart for details).     Tina Waters is a 84 y.o. female with a past medical history significant for hypertension, CHF, and thyroid disease who presents for chills, cough, shortness of breath, fatigue, altered mental status, decreased urination, and weakness.  Patient is calmly by family who reports that over the last several days patient has worsening symptoms.  They report that patient has been acting confused and on Wednesday, 4 days ago, patient had transient dysarthria.  This is resolved.  They report that they are unsure the last time the patient was moving both of her legs but today she is unable to move her left leg.  They report that her speech has resolved but she is still acting more confused than normal for her.  Patient is disoriented to time.  Patient reports having a productive cough with phlegm.  She reports no chest pain or chest tightness.  She denies any nausea vomiting, cuts patient, diarrhea, or abdominal pain.  She does report that her urine has felt slightly different and has been less in amount.  She denies recent injuries.  She denies other complaints on arrival.  She denies any headache or visual changes.   On exam, patient has weakness in her left leg.  Grip strength was symmetric.  Normal sensation throughout.  No facial droop.   Clear speech.  Normal extraocular movements and pupil exam.  No evidence of trauma seen.  Lungs were coarse bilaterally.  Chest was nontender.  Abdomen was nontender.    Given the patient's new neurologic deficits, CT was initially ordered.  For the patient's chills, altered mental status, and cough, x-ray was obtained.  She also screen laboratory testing.    Patient's lactic acid elevated at 3.2 on arrival.  Patient does not have a leukocytosis and has mild anemia.  Urinalysis did not show UTI however chest x-ray shows consolidations.  Patient found to have AKI compared to prior and elevated potassium.  Rectal temperature was 99.4 and nearly febrile.  Will obtain BNP results prior to giving the patient a significant amount of fluids.  With the weakness on exam in the left leg, and her reported recent transient dysarthria, I am concerned patient may have had TIA or currently have a stroke.   CT scan showed hypodensity within the left basal ganglia favored to represent subacute to chronic infarct.  Given the patient's left leg weakness, patient may have stroke that does not correspond to this finding.  Neurology will be called as well.  11:03 PM Neurology recommends MRI.  If there is evidence of stroke, they will see the patient.  Due to the patient's lung consolidations, her productive cough, chills, elevated lactic and altered mental status, patient given antibiotics for pneumonia.  Patient will be admitted for management.  Suspect patient will need MRI given the new leg weakness.  He will also need PT/OT evaluation.    Final Clinical Impressions(s) / ED Diagnoses   Final diagnoses:  Altered mental status, unspecified altered mental status type  Cough  Left leg weakness     Clinical Impression: 1. Altered mental status, unspecified altered mental status type   2. Cough   3. Left leg weakness     Disposition: Admit  This note was prepared with assistance of Dragon voice  recognition software. Occasional wrong-word or sound-a-like substitutions may have occurred due to the inherent limitations of voice recognition software.  Tegeler, Canary Brim, MD 04/08/18 [phone]

## 2018-04-09 ENCOUNTER — Observation Stay (HOSPITAL_BASED_OUTPATIENT_CLINIC_OR_DEPARTMENT_OTHER): Payer: Medicare Other

## 2018-04-09 ENCOUNTER — Other Ambulatory Visit: Payer: Self-pay

## 2018-04-09 ENCOUNTER — Encounter (HOSPITAL_COMMUNITY): Payer: Self-pay | Admitting: Internal Medicine

## 2018-04-09 DIAGNOSIS — I5023 Acute on chronic systolic (congestive) heart failure: Secondary | ICD-10-CM | POA: Diagnosis not present

## 2018-04-09 DIAGNOSIS — G9340 Encephalopathy, unspecified: Secondary | ICD-10-CM | POA: Diagnosis not present

## 2018-04-09 DIAGNOSIS — I361 Nonrheumatic tricuspid (valve) insufficiency: Secondary | ICD-10-CM

## 2018-04-09 DIAGNOSIS — N179 Acute kidney failure, unspecified: Secondary | ICD-10-CM | POA: Diagnosis not present

## 2018-04-09 DIAGNOSIS — R479 Unspecified speech disturbances: Secondary | ICD-10-CM

## 2018-04-09 DIAGNOSIS — R748 Abnormal levels of other serum enzymes: Secondary | ICD-10-CM | POA: Diagnosis not present

## 2018-04-09 LAB — MRSA PCR SCREENING: MRSA BY PCR: NEGATIVE

## 2018-04-09 LAB — ECHOCARDIOGRAM COMPLETE
Height: 62 in
WEIGHTICAEL: 2984.15 [oz_av]

## 2018-04-09 LAB — LACTIC ACID, PLASMA: Lactic Acid, Venous: 2.6 mmol/L (ref 0.5–1.9)

## 2018-04-09 LAB — TROPONIN I
TROPONIN I: 0.11 ng/mL — AB (ref <0.03)
Troponin I: 0.12 ng/mL (ref <0.03)
Troponin I: 0.14 ng/mL (ref <0.03)

## 2018-04-09 LAB — BASIC METABOLIC PANEL
Anion gap: 15 (ref 5–15)
BUN: 34 mg/dL — AB (ref 8–23)
CALCIUM: 7 mg/dL — AB (ref 8.9–10.3)
CHLORIDE: 104 mmol/L (ref 98–111)
CO2: 19 mmol/L — ABNORMAL LOW (ref 22–32)
CREATININE: 1.44 mg/dL — AB (ref 0.44–1.00)
GFR calc Af Amer: 37 mL/min — ABNORMAL LOW (ref >60)
GFR calc non Af Amer: 32 mL/min — ABNORMAL LOW (ref >60)
Glucose, Bld: 115 mg/dL — ABNORMAL HIGH (ref 70–99)
Potassium: 5.6 mmol/L — ABNORMAL HIGH (ref 3.5–5.1)
Sodium: 138 mmol/L (ref 135–145)

## 2018-04-09 LAB — HEMOGLOBIN A1C
HEMOGLOBIN A1C: 7.3 % — AB (ref 4.8–5.6)
Mean Plasma Glucose: 162.81 mg/dL

## 2018-04-09 LAB — T4, FREE: Free T4: 0.92 ng/dL (ref 0.82–1.77)

## 2018-04-09 LAB — GLUCOSE, CAPILLARY
GLUCOSE-CAPILLARY: 127 mg/dL — AB (ref 70–99)
GLUCOSE-CAPILLARY: 130 mg/dL — AB (ref 70–99)
Glucose-Capillary: 132 mg/dL — ABNORMAL HIGH (ref 70–99)

## 2018-04-09 LAB — CBC
HCT: 35.3 % — ABNORMAL LOW (ref 36.0–46.0)
Hemoglobin: 10.5 g/dL — ABNORMAL LOW (ref 12.0–15.0)
MCH: 28 pg (ref 26.0–34.0)
MCHC: 29.7 g/dL — AB (ref 30.0–36.0)
MCV: 94.1 fL (ref 78.0–100.0)
PLATELETS: 266 10*3/uL (ref 150–400)
RBC: 3.75 MIL/uL — ABNORMAL LOW (ref 3.87–5.11)
RDW: 16.1 % — ABNORMAL HIGH (ref 11.5–15.5)
WBC: 7.8 10*3/uL (ref 4.0–10.5)

## 2018-04-09 LAB — I-STAT CG4 LACTIC ACID, ED: LACTIC ACID, VENOUS: 2.99 mmol/L — AB (ref 0.5–1.9)

## 2018-04-09 LAB — TSH: TSH: 12.563 u[IU]/mL — ABNORMAL HIGH (ref 0.350–4.500)

## 2018-04-09 LAB — LIPID PANEL
CHOL/HDL RATIO: 2.9 ratio
Cholesterol: 92 mg/dL (ref 0–200)
HDL: 32 mg/dL — AB (ref >40)
LDL Cholesterol: 42 mg/dL (ref 0–99)
TRIGLYCERIDES: 89 mg/dL (ref <150)
VLDL: 18 mg/dL (ref 0–40)

## 2018-04-09 LAB — BRAIN NATRIURETIC PEPTIDE

## 2018-04-09 MED ORDER — HEPARIN SODIUM (PORCINE) 5000 UNIT/ML IJ SOLN
5000.0000 [IU] | Freq: Three times a day (TID) | INTRAMUSCULAR | Status: DC
  Administered 2018-04-09 – 2018-04-15 (×19): 5000 [IU] via SUBCUTANEOUS
  Filled 2018-04-09 (×19): qty 1

## 2018-04-09 MED ORDER — HYDROCODONE-ACETAMINOPHEN 5-325 MG PO TABS: 1.0000 | ORAL_TABLET | ORAL | Status: DC | PRN

## 2018-04-09 MED ORDER — CYCLOBENZAPRINE HCL 10 MG PO TABS: 5.0000 mg | ORAL_TABLET | Freq: Four times a day (QID) | ORAL | Status: DC | PRN

## 2018-04-09 MED ORDER — COLCHICINE 0.6 MG PO TABS: 0.6000 mg | ORAL_TABLET | Freq: Two times a day (BID) | ORAL | Status: DC

## 2018-04-09 MED ORDER — BARRIER CREAM NON-SPECIFIED
1.0000 "application " | TOPICAL_CREAM | Freq: Three times a day (TID) | TOPICAL | Status: DC
  Administered 2018-04-09 – 2018-04-15 (×18): 1 via TOPICAL

## 2018-04-09 MED ORDER — GABAPENTIN 300 MG PO CAPS: 300.0000 mg | ORAL_CAPSULE | Freq: Two times a day (BID) | ORAL | Status: DC

## 2018-04-09 MED ORDER — SODIUM CHLORIDE 0.9% FLUSH
3.0000 mL | INTRAVENOUS | Status: DC | PRN
  Administered 2018-04-09: 3 mL via INTRAVENOUS
  Filled 2018-04-09: qty 3

## 2018-04-09 MED ORDER — INSULIN ASPART 100 UNIT/ML ~~LOC~~ SOLN
0.0000 [IU] | Freq: Three times a day (TID) | SUBCUTANEOUS | Status: DC
  Administered 2018-04-09 – 2018-04-10 (×6): 1 [IU] via SUBCUTANEOUS
  Administered 2018-04-11: 2 [IU] via SUBCUTANEOUS
  Administered 2018-04-11 – 2018-04-13 (×5): 1 [IU] via SUBCUTANEOUS
  Administered 2018-04-13 (×2): 2 [IU] via SUBCUTANEOUS
  Administered 2018-04-14 – 2018-04-15 (×3): 1 [IU] via SUBCUTANEOUS

## 2018-04-09 MED ORDER — LEVOTHYROXINE SODIUM 50 MCG PO TABS
50.0000 ug | ORAL_TABLET | Freq: Every day | ORAL | Status: DC
  Administered 2018-04-10 – 2018-04-15 (×6): 50 ug via ORAL
  Filled 2018-04-09 (×6): qty 1

## 2018-04-09 MED ORDER — GABAPENTIN 400 MG PO CAPS: 400.0000 mg | ORAL_CAPSULE | Freq: Every day | ORAL | Status: DC

## 2018-04-09 MED ORDER — ONDANSETRON HCL 4 MG/2ML IJ SOLN: 4.0000 mg | Freq: Four times a day (QID) | INTRAMUSCULAR | Status: DC | PRN

## 2018-04-09 MED ORDER — COLCHICINE 0.6 MG PO TABS
0.3000 mg | ORAL_TABLET | Freq: Two times a day (BID) | ORAL | Status: DC
  Administered 2018-04-09 – 2018-04-10 (×3): 0.3 mg via ORAL
  Filled 2018-04-09 (×3): qty 0.5

## 2018-04-09 MED ORDER — GABAPENTIN 300 MG PO CAPS
300.0000 mg | ORAL_CAPSULE | Freq: Every day | ORAL | Status: DC
  Administered 2018-04-09 – 2018-04-10 (×2): 300 mg via ORAL
  Filled 2018-04-09 (×2): qty 1

## 2018-04-09 MED ORDER — HYDROCODONE-ACETAMINOPHEN 5-325 MG PO TABS
1.0000 | ORAL_TABLET | ORAL | Status: DC | PRN
  Administered 2018-04-09: 1 via ORAL
  Filled 2018-04-09: qty 1

## 2018-04-09 MED ORDER — SODIUM CHLORIDE 0.9% FLUSH
3.0000 mL | Freq: Two times a day (BID) | INTRAVENOUS | Status: DC
  Administered 2018-04-09 – 2018-04-15 (×9): 3 mL via INTRAVENOUS

## 2018-04-09 MED ORDER — INSULIN ASPART 100 UNIT/ML ~~LOC~~ SOLN: 0.0000 [IU] | Freq: Every day | SUBCUTANEOUS | Status: DC

## 2018-04-09 MED ORDER — FUROSEMIDE 10 MG/ML IJ SOLN
40.0000 mg | Freq: Two times a day (BID) | INTRAMUSCULAR | Status: DC
  Administered 2018-04-09 – 2018-04-11 (×6): 40 mg via INTRAVENOUS
  Filled 2018-04-09 (×6): qty 4

## 2018-04-09 MED ORDER — TIZANIDINE HCL 4 MG PO TABS: 2.0000 mg | ORAL_TABLET | Freq: Three times a day (TID) | ORAL | Status: DC | PRN

## 2018-04-09 MED ORDER — LEVOTHYROXINE SODIUM 25 MCG PO TABS
12.5000 ug | ORAL_TABLET | Freq: Every day | ORAL | Status: DC
  Administered 2018-04-09: 12.5 ug via ORAL
  Filled 2018-04-09: qty 1

## 2018-04-09 MED ORDER — ISOSORBIDE MONONITRATE ER 30 MG PO TB24
30.0000 mg | ORAL_TABLET | Freq: Every day | ORAL | Status: DC
  Administered 2018-04-09 – 2018-04-14 (×6): 30 mg via ORAL
  Filled 2018-04-09 (×6): qty 1

## 2018-04-09 MED ORDER — GABAPENTIN 100 MG PO CAPS
200.0000 mg | ORAL_CAPSULE | Freq: Two times a day (BID) | ORAL | Status: DC
  Administered 2018-04-09 – 2018-04-11 (×5): 200 mg via ORAL
  Filled 2018-04-09 (×6): qty 2

## 2018-04-09 MED ORDER — ASPIRIN EC 81 MG PO TBEC
81.0000 mg | DELAYED_RELEASE_TABLET | Freq: Every day | ORAL | Status: DC
  Administered 2018-04-09 – 2018-04-15 (×7): 81 mg via ORAL
  Filled 2018-04-09 (×7): qty 1

## 2018-04-09 MED ORDER — CINACALCET HCL 30 MG PO TABS
30.0000 mg | ORAL_TABLET | Freq: Two times a day (BID) | ORAL | Status: DC
  Administered 2018-04-09 – 2018-04-15 (×12): 30 mg via ORAL
  Filled 2018-04-09 (×14): qty 1

## 2018-04-09 MED ORDER — SODIUM CHLORIDE 0.9% FLUSH
3.0000 mL | Freq: Two times a day (BID) | INTRAVENOUS | Status: DC
  Administered 2018-04-09 – 2018-04-10 (×5): 3 mL via INTRAVENOUS

## 2018-04-09 MED ORDER — ACETAMINOPHEN 325 MG PO TABS
650.0000 mg | ORAL_TABLET | Freq: Four times a day (QID) | ORAL | Status: DC | PRN
  Administered 2018-04-13: 650 mg via ORAL
  Filled 2018-04-09: qty 2

## 2018-04-09 MED ORDER — ATORVASTATIN CALCIUM 20 MG PO TABS
20.0000 mg | ORAL_TABLET | Freq: Every day | ORAL | Status: DC
  Administered 2018-04-09 – 2018-04-13 (×5): 20 mg via ORAL
  Filled 2018-04-09 (×6): qty 1

## 2018-04-09 MED ORDER — ACETAMINOPHEN 650 MG RE SUPP: 650.0000 mg | Freq: Four times a day (QID) | RECTAL | Status: DC | PRN

## 2018-04-09 MED ORDER — CARVEDILOL 12.5 MG PO TABS
12.5000 mg | ORAL_TABLET | Freq: Two times a day (BID) | ORAL | Status: DC
  Administered 2018-04-09 – 2018-04-11 (×5): 12.5 mg via ORAL
  Filled 2018-04-09 (×6): qty 1

## 2018-04-09 MED ORDER — SODIUM CHLORIDE 0.9 % IV SOLN: 250.0000 mL | INTRAVENOUS | Status: DC | PRN

## 2018-04-09 MED ORDER — ONDANSETRON HCL 4 MG PO TABS: 4.0000 mg | ORAL_TABLET | Freq: Four times a day (QID) | ORAL | Status: DC | PRN

## 2018-04-09 MED ORDER — VENLAFAXINE HCL ER 75 MG PO CP24
75.0000 mg | ORAL_CAPSULE | Freq: Every day | ORAL | Status: DC
Start: 2018-04-09 — End: 2018-04-11
  Administered 2018-04-09 – 2018-04-11 (×3): 75 mg via ORAL
  Filled 2018-04-09 (×3): qty 1

## 2018-04-09 MED ORDER — SENNOSIDES-DOCUSATE SODIUM 8.6-50 MG PO TABS: 1.0000 | ORAL_TABLET | Freq: Every evening | ORAL | Status: DC | PRN

## 2018-04-09 NOTE — Progress Notes (Signed)
Patient admit to 3e19 from [facility] . Alert to self, place and situation only. VSS. No complaints of pain. Patient is incont of stool and urine. Purewick applied. Patient has left side weakness from old stroke. Unable to move Left leg and has limited movement in left arm. Patient has old scraches or arms and chest. General plan of care initiated. Patient /family member oriented to room and call system.

## 2018-04-09 NOTE — H&P (Signed)
History and Physical    Tina Waters HQI:[id number] DOB: July 31, 1928 DOA: 04/08/2018  PCP: Eloisa Northern, MD   Patient coming from: SNF   Chief Complaint: SOB, labored breathing, lethargy, episode of slurred speech on 8/29   HPI: Tina Waters is a 84 y.o. female with medical history significant for hypothyroidism, chronic systolic CHF, type 2 diabetes mellitus, and history of CVA with residual deficits, now presenting to the emergency department from her SNF for evaluation of lethargy, increased work of breathing, and a transient episode of slurred speech on 04/06/2018.  Patient is accompanied by her daughter who, along with the granddaughter by phone, assist with the history.  History is also obtained from the patient and SNF documentation.  Patient had had an episode of dysarthria on 04/06/2018 that has since resolved.  Family brought this to the attention of SNF personnel.  Patient was reportedly suspected to be dehydrated and started on continuous IV fluids at that time that have been continued through today.  Speech is seem to normalize, but the patient has been more lethargic than usual and family reports increased work of breathing.  She has not been coughing nor expressing any specific complaints.  She denies chest pain, fevers, or chills.  She denies abdominal pain, headache, or change in hearing or vision.  ED Course: Upon arrival to the ED, patient is found to be afebrile, saturating low 90s on room air, slightly tachypneic, and with vitals otherwise stable.  EKG features a sinus rhythm with nonspecific IVCD and LVH with repolarization abnormality.  Chest x-ray is notable for cardiomegaly, bilateral pleural effusions, and vascular congestion suggestive of CHF, as well as bibasilar atelectasis versus consolidation.  Chemistry panel is notable for potassium of 5.9 and creatinine of 1.49, up from 1.0 in June.  CBC is notable for normal WBC.  Lactic acid is elevated to 3.20.  Troponin is elevated to  0.12.  CT of the head features of hypodensity in the left basal ganglia felt to reflect subacute to chronic infarct.  Patient was given a dose of Levaquin for possible pneumonia and TSH and BNP were sent but not yet reported.  ED physician discussed the case with neurology who recommended MRI brain and formal neurologic consultation if there is acute findings on this study.  Patient remains hemodynamically stable and will be observed for ongoing evaluation and management.  Review of Systems:  All other systems reviewed and apart from HPI, are negative.  Past Medical History:  Diagnosis Date  . CHF (congestive heart failure) (HCC)   . Hypertension   . Thyroid disease     History reviewed. No pertinent surgical history.   reports that she has never smoked. She has never used smokeless tobacco. She reports that she does not drink alcohol or use drugs.  Allergies  Allergen Reactions  . Penicillins     Has patient had a PCN reaction causing immediate rash, facial/tongue/throat swelling, SOB or lightheadedness with hypotension: Yes Has patient had a PCN reaction causing severe rash involving mucus membranes or skin necrosis: No Has patient had a PCN reaction that required hospitalization:No Has patient had a PCN reaction occurring within the last 10 years: No If all of the above answers are "NO", then may proceed with Cephalosporin use.    Family History  Problem Relation Age of Onset  . Obesity Daughter      Prior to Admission medications   Medication Sig Start Date End Date Taking? Authorizing Provider  acetaminophen (TYLENOL) 500 MG tablet  Take 500 mg by mouth every 8 (eight) hours as needed for moderate pain.    [provider]  aspirin EC 81 MG tablet Take 81 mg by mouth daily.    [provider]  atorvastatin (LIPITOR) 20 MG tablet Take 20 mg by mouth at bedtime.    [provider]  carvedilol (COREG) 12.5 MG tablet Take 12.5 mg by mouth 2 (two) times  daily.    [provider]  Cholecalciferol (VITAMIN D3) 5000 units TABS Take 5,000 Units by mouth every 30 (thirty) days.    [provider]  cinacalcet (SENSIPAR) 30 MG tablet Take 30 mg by mouth 2 (two) times daily.    [provider]  colchicine 0.6 MG tablet Take 0.6 mg by mouth every 12 (twelve) hours.    [provider]  cyclobenzaprine (FLEXERIL) 5 MG tablet Take 5 mg by mouth every 6 (six) hours as needed (shoulder pain).    [provider]  furosemide (LASIX) 40 MG tablet Take 40 mg by mouth 2 (two) times daily.    [provider]  gabapentin (NEURONTIN) 300 MG capsule Take 300 mg by mouth 2 (two) times daily.    [provider]  gabapentin (NEURONTIN) 400 MG capsule Take 400 mg by mouth at bedtime.    [provider]  Infant Care Products (DERMACLOUD) CREA Apply 1 application topically 3 (three) times daily. Apply to sacrum and buttocks topically every shift to protect skin from breakdown    [provider]  insulin aspart (NOVOLOG) 100 UNIT/ML injection Inject 2-10 Units into the skin 4 (four) times daily - after meals and at bedtime. Per sliding scale if BS 151-200 = 2 units, 201-250 = 4 units, 251-300 = 6 units, 301-350 = 8 units, 351-400 = 10 units    [provider]  isosorbide mononitrate (IMDUR) 30 MG 24 hr tablet Take 30 mg by mouth daily.    [provider]  levothyroxine (SYNTHROID, LEVOTHROID) 25 MCG tablet Take 12.5 mcg by mouth daily.    [provider]  losartan (COZAAR) 100 MG tablet Take 100 mg by mouth daily.    [provider]  metFORMIN (GLUCOPHAGE) 500 MG tablet Take 500 mg by mouth 2 (two) times daily.    [provider]  nitroGLYCERIN (NITROSTAT) 0.4 MG SL tablet Place 0.4 mg under the tongue every 5 (five) minutes as needed for chest pain. For up to 3 doses. If no relief, call MD    [provider]  spironolactone (ALDACTONE) 25 MG  tablet Take 12.5 mg by mouth 2 (two) times daily.    [provider]  tiZANidine (ZANAFLEX) 2 MG tablet Take 2 mg by mouth every 8 (eight) hours as needed (shoulder pain).    [provider]  venlafaxine XR (EFFEXOR-XR) 75 MG 24 hr capsule Take 75 mg by mouth daily with breakfast.    [provider]    Physical Exam: Vitals:   04/08/18 2230 04/08/18 2245 04/08/18 2300 04/08/18 2315  BP: 125/67 113/69 123/64 126/63  Pulse: (!) 59 (!) 59 (!) 58 (!) 59  Resp: 20 (!) 21 19 18   Temp:      TempSrc:      SpO2: 93% 95% 94% 93%      Constitutional: NAD, calm  Eyes: PERTLA, lids and conjunctivae normal ENMT: Mucous membranes are moist. Posterior pharynx clear of any exudate or lesions.   Neck: normal, supple, no masses, no thyromegaly Respiratory: Diminished at bases  bilaterally. Fine rales bilaterally. No accessory muscle use.  Cardiovascular: S1 & S2 heard, regular rate and rhythm. Pitting edema to bilateral lower legs. Abdomen: No distension, no tenderness, soft. Bowel sounds normal.  Musculoskeletal: no clubbing / cyanosis. No joint deformity upper and lower extremities.    Skin: no significant rashes, lesions, ulcers. Warm, dry, well-perfused. Neurologic: no gross facial asymmetry, PERRl, EOMI. No dysarthria. Left leg weaekness.  Psychiatric: Alert and oriented to person and place only. Pleasant and cooperative.     Labs on Admission: I have personally reviewed following labs and imaging studies  CBC: Recent Labs  Lab 04/08/18 2115  WBC 7.6  NEUTROABS 5.1  HGB 11.9*  HCT 39.0  MCV 93.8  PLT 269   Basic Metabolic Panel: Recent Labs  Lab 04/08/18 2115  NA 138  K 5.9*  CL 101  CO2 26  GLUCOSE 149*  BUN 34*  CREATININE 1.49*  CALCIUM 7.4*   GFR: CrCl cannot be calculated (Unknown ideal weight.). Liver Function Tests: Recent Labs  Lab 04/08/18 2115  AST 21  ALT 8  ALKPHOS 34*  BILITOT 0.6  PROT 7.4  ALBUMIN 3.4*   No results for  input(s): LIPASE, AMYLASE in the last 168 hours. No results for input(s): AMMONIA in the last 168 hours. Coagulation Profile: Recent Labs  Lab 04/08/18 2115  INR 1.35   Cardiac Enzymes: No results for input(s): CKTOTAL, CKMB, CKMBINDEX, TROPONINI in the last 168 hours. BNP (last 3 results) No results for input(s): PROBNP in the last 8760 hours. HbA1C: No results for input(s): HGBA1C in the last 72 hours. CBG: No results for input(s): GLUCAP in the last 168 hours. Lipid Profile: No results for input(s): CHOL, HDL, LDLCALC, TRIG, CHOLHDL, LDLDIRECT in the last 72 hours. Thyroid Function Tests: No results for input(s): TSH, T4TOTAL, FREET4, T3FREE, THYROIDAB in the last 72 hours. Anemia Panel: No results for input(s): VITAMINB12, FOLATE, FERRITIN, TIBC, IRON, RETICCTPCT in the last 72 hours. Urine analysis:    Component Value Date/Time   COLORURINE YELLOW 04/08/2018 2029   APPEARANCEUR HAZY (A) 04/08/2018 2029   LABSPEC 1.017 04/08/2018 2029   PHURINE 5.0 04/08/2018 2029   GLUCOSEU NEGATIVE 04/08/2018 2029   HGBUR NEGATIVE 04/08/2018 2029   BILIRUBINUR NEGATIVE 04/08/2018 2029   KETONESUR NEGATIVE 04/08/2018 2029   PROTEINUR 30 (A) 04/08/2018 2029   NITRITE NEGATIVE 04/08/2018 2029   LEUKOCYTESUR NEGATIVE 04/08/2018 2029   Sepsis Labs: @LABRCNTIP (procalcitonin:4,lacticidven:4) ) Recent Results (from the past 240 hour(s))  Culture, blood (Routine x 2)     Status: None (Preliminary result)   Collection Time: 04/08/18  9:15 PM  Result Value Ref Range Status   Specimen Description BLOOD BLOOD RIGHT FOREARM  Final   Special Requests   Final    BOTTLES DRAWN AEROBIC AND ANAEROBIC Blood Culture adequate volume   Culture PENDING  Incomplete   Report Status PENDING  Incomplete     Radiological Exams on Admission: Dg Chest 2 View  Result Date: 04/08/2018 CLINICAL DATA:  Pt BIB [facility] from [facility], staff noted pt has had decreased LOC since Thursday, hx  dementia, CVA with deficits on the left. At baseline pt able to care for herself with assistance. At this time, pt answering questions appropriately EXAM: CHEST - 2 VIEW COMPARISON:  none FINDINGS: Low lung volumes. Suspect central pulmonary vascular congestion. Consolidation/atelectasis in the lung bases, left greater than right. Cardiomegaly. Left subclavian transvenous pacemaker. Aortic Atherosclerosis (ICD10-170.0). Moderate left and smaller right pleural effusions.  No pneumothorax.  DJD in the left shoulder.  Hiatal hernia. IMPRESSION: 1. Cardiomegaly, pleural effusions, and central pulmonary vascularity suggesting CHF. 2. Bibasilar consolidation/atelectasis, left greater than right. Electronically Signed   By: Corlis Leak M.D.   On: 04/08/2018 22:07   Ct Head Wo Contrast  Result Date: 04/08/2018 CLINICAL DATA:  Patient with decreased level of consciousness. EXAM: CT HEAD WITHOUT CONTRAST TECHNIQUE: Contiguous axial images were obtained from the base of the skull through the vertex without intravenous contrast. COMPARISON:  None. FINDINGS: Brain: Patchy hypodensity within the left basal ganglia (image 15; series 3). Ventricles and sulci are prominent compatible with atrophy. No evidence for intracranial hemorrhage, mass lesion or mass-effect. Vascular: Internal carotid arterial vascular calcifications. Skull: Intact. Sinuses/Orbits: Paranasal sinuses are well aerated. Mastoid air cells are unremarkable. Orbits unremarkable. Other: None. IMPRESSION: 1. Hypodensity within the left basal ganglia favored to represent subacute to chronic left basal ganglia infarct. Electronically Signed   By: Annia Belt M.D.   On: 04/08/2018 22:40    EKG: Independently reviewed. Sinus rhythm, non-specific IVCD, LVH with repolarization abnormality.   Assessment/Plan  1. Acute encephalopathy; transient dysarthria  - Presents from SNF with dyspnea, increased WOB, lethargy, and had a transient episode of dysarthria on 8/29    - Per family at bedside, she is more lethargic than usual but seems to have baseline speech and mental status in ED  - She has reported hx of CVA with chronic left-sided weakness  - CT head with hypodensity in left basal ganglia suggestive of subacute to chronic infarction   - TSH was sent from ED and not yet resulted  - ED physician discussed with neurology and MRI brain was recommended, ordered/not yet performed  - Continue cardiac monitoring, continue neuro checks, follow-up MRI and TSH    2. Acute on chronic systolic CHF  - Presents with dyspnea, lethargy, and after a transient episode of dysarthria on 8/29  - There is no echo report on file but she has documented hx of systolic CHF  - Per report of family, lethargy was suspected secondary to dehydration and she was started on continuous IVF at the nursing facility on 8/29 and continued through today - She was noted to have increased WOB this evening but no significant cough, denies fevers/chills, and without leukocytosis  - CXR with cardiomegaly, bilateral effusions, and vascular congestion with atelectasis vs consolidation in bases  - She was treated with Levaquin in ED, but suspect this is CHF  - Check sputum culture and procalcitonin, hold additional antibiotic for now, and diurese with Lasix 40 mg IV q12h   3. Acute kidney injury; hyperkalemia   - SCr is 1.49 on admission, up from 1.3 at SNF the day before, and up from 1.00 in June  - Serum potassium is 5.8 on admission  - AKI suspected secondary to acute CHF  - Continue cardiac monitoring, check urine chemistries, diurese with Lasix as above, hold Aldactone and losartan, repeat a potassium level overnight and BMP in am    4. History of CVA with left-sided deficits  - Patient has documented hx of CVA with left-sided weakness  - As above, she is being sent for MRI brain  - Continue ASA, Lipitor   5. Insulin-dependent DM  - No A1c on file  - Managed at SNF with metformin and  Novolog 2-10 QID  - Check CBG's and continue Novolog per sliding-scale    6. Hypothyroidism  - TSH sent from ED in light of presenting complaints, not  yet resulted  - Continue Synthroid   7. Elevated troponin  - Troponin is 0.12 in ED  - Patient denies chest pain  - EKG with repolarization abnormality likely secondary to LVH  - Continue cardiac monitoring, trend troponin, continue ASA, beta-blocker, statin, and nitrates     DVT prophylaxis: sq heparin  Code Status: Full  Family Communication: Daughter updated at bedside; granddaughter updated by phone at family request  Consults called: ED physician discussed with neurology  Admission status: Observation     Briscoe Deutscher, MD [facility] Pager [phone]  If 7PM-7AM, please contact night-coverage [email or web address] Password [facility]  04/09/2018, 12:38 AM

## 2018-04-09 NOTE — Progress Notes (Signed)
*  PRELIMINARY RESULTS* Echocardiogram 2D Echocardiogram has been performed.  Stacey Drain 04/09/2018, 12:19 PM

## 2018-04-09 NOTE — Progress Notes (Signed)
SLP Cancellation Note  Patient Details Name: Naavah Mesenbrink MRN: [id number] DOB: [birth year]   Cancelled treatment:       Reason Eval/Treat Not Completed: Patient at procedure or test/unavailable   Dawnette Mione, Riley Nearing 04/09/2018, 11:46 AM

## 2018-04-09 NOTE — Progress Notes (Addendum)
[name] has fax number and will fax PM information tomorrow when office is open

## 2018-04-09 NOTE — Progress Notes (Signed)
Patient with a pacemaker. We have no information on patients device.  Waiting on card or documentation of what the device and leads are.  MRI on hold til information is received.

## 2018-04-09 NOTE — ED Notes (Signed)
D/T presence of IVCD, MRI on hold until type of device and clearance from cardiology. Per Dr. Antionette Char, no new or changes in orders.

## 2018-04-09 NOTE — Progress Notes (Signed)
[facility] PROGRESS NOTE  Kadyn Chovan ZOX:[id number] DOB: Nov 02, 1927 DOA: 04/08/2018 PCP: Eloisa Northern, MD  Assessment/Plan: 1. Acute encephalopathy; transient dysarthria/worsening Left sided weakness -dysarthria improved this am. Some improvement left arm strength but not at baseline.  Alert oriented x3  -lactic acid trending downward, afebrile, no leukocytosis -stop antibiotic - She has reported hx of CVA with chronic left arm weakness but not left leg and left arm worse and left leg involved as of 3 days ago.   - CT head with hypodensity in left basal ganglia suggestive of subacute to chronic infarction   - ED physician discussed with neurology and MRI brain was recommended but need clearance as she has IVCD.  -if unable to determine if device MRI compatible will consider repeat CT tomorrow   -echo -PT/OT -lipid panel, A1c -asa and statin  2. Acute on chronic systolic CHF  -  received fluids for 2 days at facility for perceived dehydration 8/29. Developed dyspnea and lethargy. Hx sCHF. Home meds include coreg, lasix aldactone, cozaar -  CXR with cardiomegaly, bilateral effusions, and vascular congestion with atelectasis vs consolidation in bases  -BNP 4,5000 - She was treated with Levaquin in ED, but suspect this is CHF  -provided lasix 40mg  IV and has diuresed well.  -will continue IV lasix -follow echo -intake and output and daily weight -hold aldactone  potassium elevated  3. Acute kidney injury; hyperkalemia   -likely related to #2. - SCr is 1.49 on admission, up from 1.3 at SNF the day before, and up from 1.00 in June. This am 1.44 - Serum potassium is 5.8 on admission and down to 5.6 today - follow urine chemistries -continue Lasix as above, hold Aldactone and losartan -recheck in am  4. History of CVA with left-sided deficits  - Patient has documented hx of CVA with left-sided weakness but daughter reports worsening.  -See #1 -HgA1c, lipid panel -Speech  therapy, occupational therapy -echo  - Continue ASA, Lipitor   5. Insulin-dependent DM  -fair control - A1c pending - Managed at SNF with metformin and Novolog 2-10 QID as well as levemir - hold levemir for now -continue SSI  6. Hypothyroidism  - TSH 12.56 -obtain free T3 andT4  - Continue Synthroid   7. Elevated troponin  - Troponin is 0.12 and repeat 0.11 - Patient denies chest pain  - EKG with repolarization abnormality likely secondary to LVH  - no tele events. - continue ASA, beta-blocker, statin, and nitrates    Code Status: full Family Communication: daughter at bedside Disposition Plan: back to facility hopefully tomorrow   Consultants:  none  Procedures:    Antibiotics:  Levaquin 8/31  HPI/Subjective: Delora Gravatt is a 84 y.o. female with a past medical history significant for hypertension, CHF, and thyroid disease who presented for chills, cough, shortness of breath, fatigue, altered mental status, decreased urination, and weakness as well as worsening Left sided weakness.  Family report that patient has been acting confused for 4 days , patient had transient dysarthria.  This  resolved.  They report that they are unsure the last time the patient was moving both of her legs but today she is unable to move her left leg.  They report that her speech has resolved but she is still acting more confused than normal for her.  Patient is disoriented to time.  Patient reports having a productive cough with phlegm.  She reports no chest pain or chest tightness.  She denies any nausea vomiting, cuts  patient, diarrhea, or abdominal pain.  She does report that her urine has felt slightly different and has been less in amount.  She denies recent injuries.  She denies other complaints on arrival.  She denies any headache or visual changes.   Objective: Vitals:   04/09/18 0213 04/09/18 0604  BP: (!) 109/45 126/67  Pulse: 60 (!) 54  Resp: 20 18  Temp: 98.6 F (37 C) (!)  97.5 F (36.4 C)  SpO2: 97% 100%    Intake/Output Summary (Last 24 hours) at 04/09/2018 0915 Last data filed at 04/09/2018 0700 Gross per 24 hour  Intake 596.67 ml  Output 300 ml  Net 296.67 ml   Filed Weights   04/09/18 0213  Weight: 84.6 kg    Exam:   General:  Obese lying in bed alert in no acute distress  Cardiovascular: RRR no mgr 1-2+LE edema. Compression stockings intact  Respiratory: no increased work of breathing. respers slightly shallow and rate high end of normal. Crackles auscultated left base. Otherwise BS distant but clear  Abdomen: obese soft +BS throughout no guarding or rebounding  Musculoskeletal: joint without swelling/erythema  Neuro: alert and oriented x3. Speech clear facial symmetry tongue midline. Right grip 4/5 left grip 2/5 RLE strength 3/5 LLE strenght 1/5   Data Reviewed: Basic Metabolic Panel: Recent Labs  Lab 04/08/18 2115 04/09/18 0646  NA 138 138  K 5.9* 5.6*  CL 101 104  CO2 26 19*  GLUCOSE 149* 115*  BUN 34* 34*  CREATININE 1.49* 1.44*  CALCIUM 7.4* 7.0*   Liver Function Tests: Recent Labs  Lab 04/08/18 2115  AST 21  ALT 8  ALKPHOS 34*  BILITOT 0.6  PROT 7.4  ALBUMIN 3.4*   No results for input(s): LIPASE, AMYLASE in the last 168 hours. No results for input(s): AMMONIA in the last 168 hours. CBC: Recent Labs  Lab 04/08/18 2115 04/09/18 0646  WBC 7.6 7.8  NEUTROABS 5.1  --   HGB 11.9* 10.5*  HCT 39.0 35.3*  MCV 93.8 94.1  PLT 269 266   Cardiac Enzymes: Recent Labs  Lab 04/09/18 0017 04/09/18 0646  TROPONINI 0.11* 0.12*   BNP (last 3 results) Recent Labs    04/09/18 0017  BNP >4,500.0*    ProBNP (last 3 results) No results for input(s): PROBNP in the last 8760 hours.  CBG: Recent Labs  Lab 04/09/18 0227 04/09/18 0751  GLUCAP 127* 130*    Recent Results (from the past 240 hour(s))  Culture, blood (Routine x 2)     Status: None (Preliminary result)   Collection Time: 04/08/18  9:15 PM   Result Value Ref Range Status   Specimen Description BLOOD BLOOD RIGHT FOREARM  Final   Special Requests   Final    BOTTLES DRAWN AEROBIC AND ANAEROBIC Blood Culture adequate volume   Culture   Final    NO GROWTH < 12 HOURS Performed at [facility], [address], [place], [place] [address]    Report Status PENDING  Incomplete  Culture, blood (Routine x 2)     Status: None (Preliminary result)   Collection Time: 04/08/18 10:20 PM  Result Value Ref Range Status   Specimen Description BLOOD RIGHT ANTECUBITAL  Final   Special Requests   Final    BOTTLES DRAWN AEROBIC AND ANAEROBIC Blood Culture results may not be optimal due to an inadequate volume of blood received in culture bottles   Culture   Final    NO GROWTH < 12  HOURS Performed at [facility], [address], [place], [place] [address]    Report Status PENDING  Incomplete  MRSA PCR Screening     Status: None   Collection Time: 04/09/18  1:57 AM  Result Value Ref Range Status   MRSA by PCR NEGATIVE NEGATIVE Final    Comment:        The GeneXpert MRSA Assay (FDA approved for NASAL specimens only), is one component of a comprehensive MRSA colonization surveillance program. It is not intended to diagnose MRSA infection nor to guide or monitor treatment for MRSA infections. Performed at [facility], [address], [place], [place] [address]      Studies: Dg Chest 2 View  Result Date: 04/08/2018 CLINICAL DATA:  Pt BIB [facility] from [facility], staff noted pt has had decreased LOC since Thursday, hx dementia, CVA with deficits on the left. At baseline pt able to care for herself with assistance. At this time, pt answering questions appropriately EXAM: CHEST - 2 VIEW COMPARISON:  none FINDINGS: Low lung volumes. Suspect central pulmonary vascular congestion. Consolidation/atelectasis in the lung bases, left greater than right. Cardiomegaly. Left subclavian transvenous pacemaker. Aortic  Atherosclerosis (ICD10-170.0). Moderate left and smaller right pleural effusions.  No pneumothorax. DJD in the left shoulder.  Hiatal hernia. IMPRESSION: 1. Cardiomegaly, pleural effusions, and central pulmonary vascularity suggesting CHF. 2. Bibasilar consolidation/atelectasis, left greater than right. Electronically Signed   By: Corlis Leak M.D.   On: 04/08/2018 22:07   Ct Head Wo Contrast  Result Date: 04/08/2018 CLINICAL DATA:  Patient with decreased level of consciousness. EXAM: CT HEAD WITHOUT CONTRAST TECHNIQUE: Contiguous axial images were obtained from the base of the skull through the vertex without intravenous contrast. COMPARISON:  None. FINDINGS: Brain: Patchy hypodensity within the left basal ganglia (image 15; series 3). Ventricles and sulci are prominent compatible with atrophy. No evidence for intracranial hemorrhage, mass lesion or mass-effect. Vascular: Internal carotid arterial vascular calcifications. Skull: Intact. Sinuses/Orbits: Paranasal sinuses are well aerated. Mastoid air cells are unremarkable. Orbits unremarkable. Other: None. IMPRESSION: 1. Hypodensity within the left basal ganglia favored to represent subacute to chronic left basal ganglia infarct. Electronically Signed   By: Annia Belt M.D.   On: 04/08/2018 22:40    Scheduled Meds: . aspirin EC  81 mg Oral Daily  . atorvastatin  20 mg Oral q1800  . barrier cream  1 application Topical TID  . carvedilol  12.5 mg Oral BID WC  . cinacalcet  30 mg Oral BID WC  . colchicine  0.3 mg Oral Q12H  . furosemide  40 mg Intravenous BID  . gabapentin  200 mg Oral BID WC  . gabapentin  300 mg Oral QHS  . heparin  5,000 Units Subcutaneous Q8H  . insulin aspart  0-5 Units Subcutaneous QHS  . insulin aspart  0-9 Units Subcutaneous TID WC  . isosorbide mononitrate  30 mg Oral Daily  . levothyroxine  12.5 mcg Oral QAC breakfast  . sodium chloride flush  3 mL Intravenous Q12H  . sodium chloride flush  3 mL Intravenous Q12H  .  venlafaxine XR  75 mg Oral Q breakfast   Continuous Infusions: . sodium chloride      Principal Problem:   Acute on chronic systolic CHF (congestive heart failure) (HCC) Active Problems:   AKI (acute kidney injury) (HCC)   Insulin-requiring or dependent type II diabetes mellitus (HCC)   Acute encephalopathy   History of CVA with residual  deficit   Elevated troponin   Hyperkalemia   Elevated lactic acid level   Transient speech disturbance   Hypothyroidism    Time spent: 65 minutes    [facility] M  [facility]  If 7PM-7AM, please contact night-coverage at [email or web address], password [facility] 04/09/2018, 9:15 AM  LOS: 0 days

## 2018-04-10 ENCOUNTER — Observation Stay (HOSPITAL_COMMUNITY): Payer: Medicare Other

## 2018-04-10 DIAGNOSIS — R05 Cough: Secondary | ICD-10-CM | POA: Diagnosis present

## 2018-04-10 DIAGNOSIS — E875 Hyperkalemia: Secondary | ICD-10-CM | POA: Diagnosis present

## 2018-04-10 DIAGNOSIS — G9349 Other encephalopathy: Secondary | ICD-10-CM | POA: Diagnosis present

## 2018-04-10 DIAGNOSIS — I130 Hypertensive heart and chronic kidney disease with heart failure and stage 1 through stage 4 chronic kidney disease, or unspecified chronic kidney disease: Secondary | ICD-10-CM | POA: Diagnosis present

## 2018-04-10 DIAGNOSIS — I5043 Acute on chronic combined systolic (congestive) and diastolic (congestive) heart failure: Secondary | ICD-10-CM | POA: Diagnosis present

## 2018-04-10 DIAGNOSIS — Z79890 Hormone replacement therapy (postmenopausal): Secondary | ICD-10-CM | POA: Diagnosis not present

## 2018-04-10 DIAGNOSIS — I428 Other cardiomyopathies: Secondary | ICD-10-CM | POA: Diagnosis present

## 2018-04-10 DIAGNOSIS — Z23 Encounter for immunization: Secondary | ICD-10-CM | POA: Diagnosis present

## 2018-04-10 DIAGNOSIS — I69354 Hemiplegia and hemiparesis following cerebral infarction affecting left non-dominant side: Secondary | ICD-10-CM | POA: Diagnosis not present

## 2018-04-10 DIAGNOSIS — J189 Pneumonia, unspecified organism: Secondary | ICD-10-CM | POA: Diagnosis present

## 2018-04-10 DIAGNOSIS — E039 Hypothyroidism, unspecified: Secondary | ICD-10-CM | POA: Diagnosis present

## 2018-04-10 DIAGNOSIS — N179 Acute kidney failure, unspecified: Secondary | ICD-10-CM | POA: Diagnosis present

## 2018-04-10 DIAGNOSIS — I361 Nonrheumatic tricuspid (valve) insufficiency: Secondary | ICD-10-CM | POA: Diagnosis present

## 2018-04-10 DIAGNOSIS — R471 Dysarthria and anarthria: Secondary | ICD-10-CM | POA: Diagnosis present

## 2018-04-10 DIAGNOSIS — R748 Abnormal levels of other serum enzymes: Secondary | ICD-10-CM | POA: Diagnosis not present

## 2018-04-10 DIAGNOSIS — E860 Dehydration: Secondary | ICD-10-CM | POA: Diagnosis present

## 2018-04-10 DIAGNOSIS — E1122 Type 2 diabetes mellitus with diabetic chronic kidney disease: Secondary | ICD-10-CM | POA: Diagnosis present

## 2018-04-10 DIAGNOSIS — I5023 Acute on chronic systolic (congestive) heart failure: Secondary | ICD-10-CM | POA: Diagnosis not present

## 2018-04-10 DIAGNOSIS — R7989 Other specified abnormal findings of blood chemistry: Secondary | ICD-10-CM | POA: Diagnosis present

## 2018-04-10 DIAGNOSIS — I471 Supraventricular tachycardia: Secondary | ICD-10-CM | POA: Diagnosis not present

## 2018-04-10 DIAGNOSIS — D638 Anemia in other chronic diseases classified elsewhere: Secondary | ICD-10-CM | POA: Diagnosis present

## 2018-04-10 DIAGNOSIS — R0902 Hypoxemia: Secondary | ICD-10-CM | POA: Diagnosis not present

## 2018-04-10 DIAGNOSIS — N189 Chronic kidney disease, unspecified: Secondary | ICD-10-CM | POA: Diagnosis not present

## 2018-04-10 DIAGNOSIS — N183 Chronic kidney disease, stage 3 (moderate): Secondary | ICD-10-CM | POA: Diagnosis present

## 2018-04-10 DIAGNOSIS — Z794 Long term (current) use of insulin: Secondary | ICD-10-CM | POA: Diagnosis not present

## 2018-04-10 DIAGNOSIS — E669 Obesity, unspecified: Secondary | ICD-10-CM | POA: Diagnosis present

## 2018-04-10 DIAGNOSIS — I6930 Unspecified sequelae of cerebral infarction: Secondary | ICD-10-CM | POA: Diagnosis not present

## 2018-04-10 DIAGNOSIS — Z7982 Long term (current) use of aspirin: Secondary | ICD-10-CM | POA: Diagnosis not present

## 2018-04-10 DIAGNOSIS — G9340 Encephalopathy, unspecified: Secondary | ICD-10-CM | POA: Diagnosis not present

## 2018-04-10 LAB — URINE CULTURE: CULTURE: NO GROWTH

## 2018-04-10 LAB — BASIC METABOLIC PANEL
Anion gap: 11 (ref 5–15)
BUN: 32 mg/dL — AB (ref 8–23)
CHLORIDE: 102 mmol/L (ref 98–111)
CO2: 27 mmol/L (ref 22–32)
Calcium: 7.2 mg/dL — ABNORMAL LOW (ref 8.9–10.3)
Creatinine, Ser: 1.48 mg/dL — ABNORMAL HIGH (ref 0.44–1.00)
GFR calc Af Amer: 35 mL/min — ABNORMAL LOW (ref >60)
GFR calc non Af Amer: 31 mL/min — ABNORMAL LOW (ref >60)
GLUCOSE: 129 mg/dL — AB (ref 70–99)
POTASSIUM: 4.5 mmol/L (ref 3.5–5.1)
Sodium: 140 mmol/L (ref 135–145)

## 2018-04-10 LAB — CBC
HEMATOCRIT: 33.9 % — AB (ref 36.0–46.0)
HEMOGLOBIN: 10.3 g/dL — AB (ref 12.0–15.0)
MCH: 28.4 pg (ref 26.0–34.0)
MCHC: 30.4 g/dL (ref 30.0–36.0)
MCV: 93.4 fL (ref 78.0–100.0)
Platelets: 258 10*3/uL (ref 150–400)
RBC: 3.63 MIL/uL — AB (ref 3.87–5.11)
RDW: 16.1 % — ABNORMAL HIGH (ref 11.5–15.5)
WBC: 7.7 10*3/uL (ref 4.0–10.5)

## 2018-04-10 LAB — GLUCOSE, CAPILLARY
GLUCOSE-CAPILLARY: 132 mg/dL — AB (ref 70–99)
GLUCOSE-CAPILLARY: 143 mg/dL — AB (ref 70–99)
Glucose-Capillary: 136 mg/dL — ABNORMAL HIGH (ref 70–99)
Glucose-Capillary: 149 mg/dL — ABNORMAL HIGH (ref 70–99)
Glucose-Capillary: 157 mg/dL — ABNORMAL HIGH (ref 70–99)

## 2018-04-10 NOTE — Progress Notes (Signed)
Pt was bathed and bed linen change, pt went to CT earlier and purewick removed and when returned it was not replaced, pt was incontienent of large amount of urine, pt says she did not realize she had urinated, skin intact, moisture barrier applied for protection, purewick replaced

## 2018-04-10 NOTE — Evaluation (Signed)
Physical Therapy Evaluation Patient Details Name: Tina Waters MRN: [id number] DOB: 1927-11-13 Today's Date: 04/10/2018   History of Present Illness  Pt is an 84 y/o female presenting from SNF secondary to AMS and worsening L sided weakness. Thought to have acute encephalopathy. Chest imaging findings consistent with CHF and CT of head revealed acute to subacute infarct in L basal ganglia. PMH includes CHF, HTN, CVA with L sided deficits, pacemaker, and dementia.   Clinical Impression  Pt admitted secondary to problem above with deficits below. Pt presenting with L lateral lean in sitting, L sided weakness, and decreased sitting balance. Required max A to perform bed mobility and max A to maintain sitting balance this session. Feel pt is at increased risk for falls and recommend return to SNF at d/c. Will continue to follow acutely to maximize functional mobility independence and safety.     Follow Up Recommendations SNF;Supervision/Assistance - 24 hour    Equipment Recommendations  None recommended by PT    Recommendations for Other Services       Precautions / Restrictions Precautions Precautions: Fall Restrictions Weight Bearing Restrictions: No      Mobility  Bed Mobility Overal bed mobility: Needs Assistance Bed Mobility: Supine to Sit;Sit to Supine     Supine to sit: Max assist Sit to supine: Max assist   General bed mobility comments: Max A for LE assist and trunk elevation to come to sitting at EOB. Also required assist to scoot hips to EOB. Pt with very poor balance and reliant on max A to maintain upright, as pt with heavy L lateral lean. Unsafe to attempt standing with +1, therefore returned pt to supine. Max A for LE and controlled trunk descent.   Transfers                 General transfer comment: Unsafe to attempt.   Ambulation/Gait                Stairs            Wheelchair Mobility    Modified Rankin (Stroke Patients Only)        Balance Overall balance assessment: Needs assistance Sitting-balance support: Single extremity supported;Feet supported Sitting balance-Leahy Scale: Poor Sitting balance - Comments: Reliant on Max A to maintain sitting balance secondary to L lateral lean.  Postural control: Left lateral lean                                   Pertinent Vitals/Pain Pain Assessment: No/denies pain Faces Pain Scale: Hurts a little bit Pain Intervention(s): Monitored during session    Home Living Family/patient expects to be discharged to:: Skilled nursing facility                      Prior Function Level of Independence: Needs assistance   Gait / Transfers Assistance Needed: Reports she uses WC and needs assist with transfers from staff at SNF.   ADL's / Homemaking Assistance Needed: Reports she needs assist from staff at [facility].         Hand Dominance   Dominant Hand: Right    Extremity/Trunk Assessment   Upper Extremity Assessment Upper Extremity Assessment: Defer to OT evaluation(LUE weaker than RUE)    Lower Extremity Assessment Lower Extremity Assessment: LLE deficits/detail LLE Deficits / Details: 0/5 throughout LLE, however, did demonstrate good PROM throughout joints of LLE.  Cervical / Trunk Assessment Cervical / Trunk Assessment: Other exceptions Cervical / Trunk Exceptions: L lateral lean   Communication   Communication: No difficulties  Cognition Arousal/Alertness: Awake/alert Behavior During Therapy: Flat affect Overall Cognitive Status: No family/caregiver present to determine baseline cognitive functioning                                 General Comments: Disoriented to situation. Presenting with some slowed cognitive processing.       General Comments      Exercises General Exercises - Lower Extremity Ankle Circles/Pumps: PROM;Left;5 reps Heel Slides: PROM;Left;5 reps Hip ABduction/ADduction: PROM;Left;5 reps    Assessment/Plan    PT Assessment Patient needs continued PT services  PT Problem List Decreased strength;Decreased balance;Decreased mobility;Decreased cognition;Decreased knowledge of use of DME;Decreased safety awareness;Decreased knowledge of precautions       PT Treatment Interventions DME instruction;Therapeutic activities;Functional mobility training;Therapeutic exercise;Balance training;Patient/family education    PT Goals (Current goals can be found in the Care Plan section)  Acute Rehab PT Goals Patient Stated Goal: to go back to SNF at d/c  PT Goal Formulation: With patient Time For Goal Achievement: 04/24/18 Potential to Achieve Goals: Fair    Frequency Min 2X/week   Barriers to discharge        Co-evaluation               AM-PAC PT "6 Clicks" Daily Activity  Outcome Measure Difficulty turning over in bed (including adjusting bedclothes, sheets and blankets)?: Unable Difficulty moving from lying on back to sitting on the side of the bed? : Unable Difficulty sitting down on and standing up from a chair with arms (e.g., wheelchair, bedside commode, etc,.)?: Unable Help needed moving to and from a bed to chair (including a wheelchair)?: Total Help needed walking in hospital room?: Total Help needed climbing 3-5 steps with a railing? : Total 6 Click Score: 6    End of Session   Activity Tolerance: Patient tolerated treatment well Patient left: in bed;with call bell/phone within reach;with bed alarm set Nurse Communication: Mobility status PT Visit Diagnosis: Unsteadiness on feet (R26.81);Hemiplegia and hemiparesis;Difficulty in walking, not elsewhere classified (R26.2);Muscle weakness (generalized) (M62.81) Hemiplegia - Right/Left: Left Hemiplegia - dominant/non-dominant: Non-dominant Hemiplegia - caused by: Cerebral infarction    Time: 1610-9604 PT Time Calculation (min) (ACUTE ONLY): 13 min   Charges:   PT Evaluation $PT Eval Moderate Complexity: 1  Mod          Gladys Damme, PT, DPT  Acute Rehabilitation Services  Pager: [phone]   [facility] Prom 04/10/2018, 9:30 AM

## 2018-04-10 NOTE — Evaluation (Signed)
Clinical/Bedside Swallow Evaluation Patient Details  Name: Tina Waters MRN: [id number] Date of Birth: 06-Sep-1927  Today's Date: 04/10/2018 Time: SLP Start Time (ACUTE ONLY): 0755 SLP Stop Time (ACUTE ONLY): 0815 SLP Time Calculation (min) (ACUTE ONLY): 20 min  Past Medical History:  Past Medical History:  Diagnosis Date  . CHF (congestive heart failure) (HCC)   . Hypertension   . Stroke (HCC)   . Thyroid disease    Past Surgical History: History reviewed. No pertinent surgical history. HPI:  Tina Waters is a 84 y.o. female with a past medical history significant for hypertension, CHF, and thyroid disease who presented for chills, cough, shortness of breath, fatigue, altered mental status, decreased urination, and weakness as well as worsening Left sided weakness.  Family report that patient has been acting confused for 4 days , patient had transient dysarthria.  This  resolved.. CXR shows CHF bibasilar consolidation/atelectasis.    Assessment / Plan / Recommendation Clinical Impression  During oral motor examination, pt exhibited slightly irregular left-sided facial symmetry and left-sided lingual weakness, likely due to CVA. Facial, lingual, labial ROM appeared intact. When pt expressed concern regarding reason for swallowing evaluation, SLP educated regarding correlation regarding stroke (her dx) and impaired swallowing as well as aspiration precautions. Pt transited 3oz water from cup, followed by throat clear. No overt s/s of aspiration observed during presentation of thin from straw, purree and regular texure PO's. Prolonged mastication and suspected delayed oral transit observed with regular texture, however this appears consistent with pt reported baseline. Given pt's baseline and current presentation with PO's today, recommend continue regular texture diet, thin liquids, straws allowed and no f/u with ST indicated.  SLP Visit Diagnosis: Dysphagia, unspecified (R13.10)    Aspiration  Risk  No limitations    Diet Recommendation Regular;Thin liquid   Liquid Administration via: Cup;Straw Medication Administration: Whole meds with liquid Supervision: Staff to assist with self feeding;Patient able to self feed Compensations: Minimize environmental distractions;Small sips/bites Postural Changes: Seated upright at 90 degrees    Other  Recommendations Oral Care Recommendations: Oral care BID   Follow up Recommendations        Frequency and Duration (no f/u necessary)          Prognosis Prognosis for Safe Diet Advancement: Good      Swallow Study   General HPI: Tina Waters is a 84 y.o. female with a past medical history significant for hypertension, CHF, and thyroid disease who presented for chills, cough, shortness of breath, fatigue, altered mental status, decreased urination, and weakness as well as worsening Left sided weakness.  Family report that patient has been acting confused for 4 days , patient had transient dysarthria.  This  resolved.. CXR shows CHF bibasilar consolidation/atelectasis.  Type of Study: Bedside Swallow Evaluation Diet Prior to this Study: Regular;Thin liquids Temperature Spikes Noted: No Respiratory Status: Room air History of Recent Intubation: No Behavior/Cognition: Alert;Cooperative(concerned about reason for BSE - SLP educated) Oral Cavity Assessment: Within Functional Limits Oral Care Completed by SLP: No Oral Cavity - Dentition: Dentures, top;Dentures, bottom(bottom denture partial) Vision: Functional for self-feeding Self-Feeding Abilities: Able to feed self;Needs assist Patient Positioning: Upright in bed Baseline Vocal Quality: Normal Volitional Cough: Strong Volitional Swallow: Able to elicit    Oral/Motor/Sensory Function Overall Oral Motor/Sensory Function: Mild impairment Facial ROM: Within Functional Limits Facial Symmetry: Within Functional Limits Facial Strength: Reduced left Lingual ROM: Within Functional  Limits Lingual Symmetry: Abnormal symmetry left Lingual Strength: Reduced Velum: Within Functional Limits Mandible: Within Functional Limits  Ice Chips Ice chips: Not tested   Thin Liquid Thin Liquid: Within functional limits    Nectar Thick Nectar Thick Liquid: Not tested   Honey Thick Honey Thick Liquid: Not tested   Puree Puree: Within functional limits Presentation: Self Fed;Spoon   Solid  Tina Waters, Student SLP     Solid: Within functional limits Presentation: Self Fed(with assist - cutting food)      Tina Waters 04/10/2018,9:07 AM

## 2018-04-10 NOTE — Progress Notes (Signed)
Worked with therapy, ra sats 85-87% with walking and noticeable DOE, pt after sitting initially was ra 88%, after several minutes ra 93-95% Dr Algie Coffer was still up on unit and in to see pt, will put in for 1 time lasix order, pt resting without distress after several min

## 2018-04-10 NOTE — Evaluation (Signed)
Occupational Therapy Evaluation and Discharge Patient Details Name: Tina Waters MRN: [id number] DOB: 09-21-1927 Today's Date: 04/10/2018    History of Present Illness Pt is an 84 y/o female presenting from SNF secondary to AMS and worsening L sided weakness. Thought to have acute encephalopathy. Chest imaging findings consistent with CHF and CT of head revealed acute to subacute infarct in L basal ganglia. PMH includes CHF, HTN, CVA with L sided deficits, pacemaker, and dementia.    Clinical Impression   This 84 yo female admitted with above presents to acute OT with decreased mobility and decreased attention to LUE (but will use if functionally when cued to do so)--both affecting her ability to A with her basic ADLs. Pt could benefit from continued OT at SNF pending how they feel she is doing from her baseline. Acute OT will sign off.     Follow Up Recommendations  SNF;Supervision/Assistance - 24 hour    Equipment Recommendations  None recommended by OT       Precautions / Restrictions Precautions Precautions: Fall Restrictions Weight Bearing Restrictions: No      Mobility Bed Mobility Overal bed mobility: Needs Assistance Bed Mobility: Rolling Rolling: Max assist(right and left (can bend right knee better than left and reach with RUE better than reaching with LUE)    Transfers                 General transfer comment: Unsafe to attempt.         ADL either performed or assessed with clinical judgement   ADL Overall ADL's : Needs assistance/impaired Eating/Feeding: Set up;Bed level   Grooming: Minimal assistance;Bed level Grooming Details (indicate cue type and reason): minimal A for mouth care, can wash hands post handing her a washcloth, can wash face post handing her a washcloth Upper Body Bathing: Minimal assistance;Bed level   Lower Body Bathing: Total assistance;Bed level   Upper Body Dressing : Maximal assistance;Bed level   Lower Body Dressing:  Total assistance;Bed level                       Vision Baseline Vision/History: Wears glasses Wears Glasses: At all times              Pertinent Vitals/Pain Pain Assessment: No/denies pain Faces Pain Scale: Hurts a little bit Pain Intervention(s): Monitored during session     Hand Dominance Right   Extremity/Trunk Assessment Upper Extremity Assessment Upper Extremity Assessment: LUE deficits/detail LUE Deficits / Details: Tends to keep left hand closed (but can open when asked, has trigger finger as well), can use her LUE functionally for basic ADLs and grooming but moves slower LUE Coordination: decreased fine motor;decreased gross motor     Communication Communication Communication: No difficulties   Cognition Arousal/Alertness: Awake/alert Behavior During Therapy: Flat affect Overall Cognitive Status: No family/caregiver present to determine baseline cognitive functioning                                 General Comments: Pt does have dementia listed in her history              Home Living Family/patient expects to be discharged to:: Skilled nursing facility                                        Prior Functioning/Environment  Level of Independence: Needs assistance  Gait / Transfers Assistance Needed: Reports she uses WC and needs assist with transfers from staff at SNF.  ADL's / Homemaking Assistance Needed: Reports she can do most of her UBB and grooming post setup, but needs A for LBB. Reports she needs A with dressing (UB/LB), total A for toileting (wears Depends)            OT Problem List: Decreased strength;Decreased range of motion;Impaired UE functional use         OT Goals(Current goals can be found in the care plan section) Acute Rehab OT Goals Patient Stated Goal: to go back to SNF at d/c   OT Frequency:                AM-PAC PT "6 Clicks" Daily Activity     Outcome Measure Help from another  person eating meals?: A Little Help from another person taking care of personal grooming?: A Little Help from another person toileting, which includes using toliet, bedpan, or urinal?: Total Help from another person bathing (including washing, rinsing, drying)?: A Lot Help from another person to put on and taking off regular upper body clothing?: A Lot Help from another person to put on and taking off regular lower body clothing?: Total 6 Click Score: 12   End of Session    Activity Tolerance: Patient tolerated treatment well Patient left: in bed;with call bell/phone within reach;with bed alarm set  OT Visit Diagnosis: Other abnormalities of gait and mobility (R26.89);Muscle weakness (generalized) (M62.81);Hemiplegia and hemiparesis Hemiplegia - Right/Left: Left Hemiplegia - dominant/non-dominant: Non-Dominant Hemiplegia - caused by: Cerebral infarction                Time: 5189-8421 OT Time Calculation (min): 21 min Charges:  OT General Charges $OT Visit: 1 Visit OT Evaluation $OT Eval Moderate Complexity: 1 Mod Ignacia Palma, OTR/L Acute [employer] [phone] Office [phone]

## 2018-04-10 NOTE — Progress Notes (Addendum)
Progress Note    Tina Waters  ZOX:[id number] DOB: 08-13-1927  DOA: 04/08/2018 PCP: Eloisa Northern, MD    Brief Narrative:   Chief complaint: worsening left-sided weakness, lethargy and SOB  Medical records reviewed and are as summarized below:  Tina Waters is an 84 y.o. female with pmh hypothyroidism, chronic systolic HF (no previous EF on file), DM2, hx CVA with residual left hemiparesis admitted on 9/1 after presented from SNF with weakness, lethargy and sob. There was also mention of transient dysarthria that resolved upon arrival to ED. Prior to this admission, pt felt to be dehydrated at SNF and given continuous IVFs.      On arrival to ED, pt tachypneic, O2 sats low 90's on RA, cxr c/w CHG. CMET notable for K+ 5.9, Cr 1.49, lactic acid 3.2. Pt initally placed on empiric Levaquin for pna that was d/c'd on 9/1 with no evidence of infection. In regards to left hemiparesis, initial CT showed subacute vs chronic left basal ganglia infarct. Pt symptoms chronic, but children felt weakness more notable. Unable to obtain MRI as pt has pacemaker.   Assessment/Plan:   Principal Problem:   Acute on chronic systolic CHF (congestive heart failure) (HCC) Active Problems:   Hypothyroidism   Insulin-requiring or dependent type II diabetes mellitus (HCC)   AKI (acute kidney injury) (HCC)   Acute encephalopathy   History of CVA with residual deficit   Elevated troponin   Hyperkalemia   Elevated lactic acid level   Transient speech disturbance     1. Acute encephalophathy; transiet dysarthria with question of worsening left sided weakness -chronic left hemiplegia with hx of CVA, but per RN children felt symptoms worse. Initial CT head showed subacute vs chronic basal ganglia infarct that was actually on the left side. I spoke with SNF staff who was really unable to assist with helping me determine baseline functioning.  -Unable to obtain MRI.  -CT head repeated and reviewed with  neuroradiology and found to be more consistent with dilated peri-vascular space -No evidence of infectious etiology despite elevated lactic acid on admit (2.9>>2.6). Empiric Levaquin d/c'd on 9/1. Pt afebrile with normal WBC, blood and urine cultures negative to date -Continue PT/OT  2. Acute on chronic systolic HF/grade I diastolic dysfunction -suspected dehydration at SNF and received IVF's prior to this admit. BNP >4500 -2Decho 04/10/18 shows EF 20-25%, grade I diastolic dysfunction. No old studies for comparison -I/O's difficult given incontinence, but with ~3lb weight loss since admit. Still with mild crackles. Continue IV Lasix 40mg  BID, Coreg, Imdur -will try to get old records from [place]  3. Acute kidney injury, mild -Uncertain of baseline Cr. 0.92 in 3/19. 1.44>>1.48 inpt -Monitor with IV Lasix.  -holding ARB, Aldactone, metformin. Will hold Colchicine for now also  4. Hyperkalemia -resolved 5.9>>4.5 -Can likely d/c on Lasix alone, continue holding aldactone -monitor diuresis  5. Hx of CVA with chronic left-sided deficits -daughter reported worsening weakness on arrival to ED with transient dysarthria. -CT head subacute vs chronic basal ganglia infarct on left side -unable to obtain MRI d/t pacemaker. -see repeat CT information as above  6. IDDM -A1C 7.3% -Continue SSI. Holding Metformin with above issues. Consider restarting Levemir in am if continues with good po intake. Reasonable control for now  7. Hypothyroidism -TSH 12.56, free T4 0.92 -Synthroid listed on med rec. For unknown reason, pt initially started on 12.30mcg on admit. SNF is unable to verify what dose pt was on prior to admit as  she has been "discharged from their system" -Continue for now. Would recommend recheck 6 weeks  8. Elevated troponin, mild -0.11>>0.12>>0.14. Suspect some demand ischemia r/t above issues -she denies any chest pain. No evidence of ischemia on ekg -continue ASA, imdur,  BB, statin     Family Communication/Anticipated D/C date and plan/Code Status   DVT prophylaxis: SQ heparin Code Status: Full Code.  Family Communication: at bedside Disposition Plan: diurese another day   Medical Consultants:    None.   Anti-Infectives:    Levaquin- now d/c'd  Subjective:   No complaints today. Pt states she feels she is breathing better, but still with some SOB. Feels like she is no weaker than baseline, but poor historian  Objective:    Vitals:   04/09/18 2042 04/10/18 0453 04/10/18 0500 04/10/18 0900  BP: 115/69 118/77  (!) 100/57  Pulse: (!) 59 79  (!) 58  Resp: 18 18  20   Temp: 98.2 F (36.8 C) 98.1 F (36.7 C)  98.9 F (37.2 C)  TempSrc: Oral Oral  Oral  SpO2: 96% 97%  95%  Weight:   83.3 kg   Height:        Intake/Output Summary (Last 24 hours) at 04/10/2018 1144 Last data filed at 04/10/2018 0905 Gross per 24 hour  Intake 926 ml  Output 1176 ml  Net -250 ml   Filed Weights   04/09/18 0213 04/10/18 0500  Weight: 84.6 kg 83.3 kg    Exam: General: awake, alert sitting upright in bed eating  CV: S1S2 RRR, no m/r/g. Compression stockings in place Resp: bilateral crackles scattered. No wheeze or rales, no increased wob GI: abdomen soft, NT/ND, BS+ Neuro: Limited exam based on cooperation. No facial asymmetry, tongue is midline. 5/5 strength RUE/RLE, 3/5 strength on left side perhaps 2/5 in lower extremity.    Data Reviewed:   I have personally reviewed following labs and imaging studies:  Labs: Labs show the following:   Basic Metabolic Panel: Recent Labs  Lab 04/08/18 2115 04/09/18 0646 04/10/18 0432  NA 138 138 140  K 5.9* 5.6* 4.5  CL 101 104 102  CO2 26 19* 27  GLUCOSE 149* 115* 129*  BUN 34* 34* 32*  CREATININE 1.49* 1.44* 1.48*  CALCIUM 7.4* 7.0* 7.2*   GFR Estimated Creatinine Clearance: 26.8 mL/min (A) (by C-G formula based on SCr of 1.48 mg/dL (H)). Liver Function Tests: Recent Labs  Lab 04/08/18 2115   AST 21  ALT 8  ALKPHOS 34*  BILITOT 0.6  PROT 7.4  ALBUMIN 3.4*   No results for input(s): LIPASE, AMYLASE in the last 168 hours. No results for input(s): AMMONIA in the last 168 hours. Coagulation profile Recent Labs  Lab 04/08/18 2115  INR 1.35    CBC: Recent Labs  Lab 04/08/18 2115 04/09/18 0646 04/10/18 0432  WBC 7.6 7.8 7.7  NEUTROABS 5.1  --   --   HGB 11.9* 10.5* 10.3*  HCT 39.0 35.3* 33.9*  MCV 93.8 94.1 93.4  PLT 269 266 258   Cardiac Enzymes: Recent Labs  Lab 04/09/18 0017 04/09/18 0646 04/09/18 1219  TROPONINI 0.11* 0.12* 0.14*   BNP (last 3 results) No results for input(s): PROBNP in the last 8760 hours. CBG: Recent Labs  Lab 04/09/18 0227 04/09/18 0751 04/09/18 1255 04/09/18 1543 04/10/18 0759  GLUCAP 127* 130* 136* 132* 149*   D-Dimer: No results for input(s): DDIMER in the last 72 hours. Hgb A1c: Recent Labs    04/09/18 0646  HGBA1C 7.3*   Lipid Profile: Recent Labs    04/09/18 0646  CHOL 92  HDL 32*  LDLCALC 42  TRIG 89  CHOLHDL 2.9   Thyroid function studies: Recent Labs    04/09/18 0017  TSH 12.563*   Anemia work up: No results for input(s): VITAMINB12, FOLATE, FERRITIN, TIBC, IRON, RETICCTPCT in the last 72 hours. Sepsis Labs: Recent Labs  Lab 04/08/18 2044 04/08/18 2115 04/08/18 2245 04/09/18 0033 04/09/18 0308 04/09/18 0646 04/10/18 0432  WBC  --  7.6  --   --   --  7.8 7.7  LATICACIDVEN 3.20*  --  2.94* 2.99* 2.6*  --   --     Microbiology Recent Results (from the past 240 hour(s))  Urine culture     Status: None   Collection Time: 04/08/18  8:29 PM  Result Value Ref Range Status   Specimen Description URINE, RANDOM  Final   Special Requests NONE  Final   Culture   Final    NO GROWTH Performed at [facility], [address] [place], [place] [address]    Report Status 04/10/2018 FINAL  Final  Culture, blood (Routine x 2)     Status: None (Preliminary result)   Collection Time: 04/08/18   9:15 PM  Result Value Ref Range Status   Specimen Description BLOOD BLOOD RIGHT FOREARM  Final   Special Requests   Final    BOTTLES DRAWN AEROBIC AND ANAEROBIC Blood Culture adequate volume   Culture   Final    NO GROWTH 2 DAYS Performed at [facility], [address], [place], [place] [address]    Report Status PENDING  Incomplete  Culture, blood (Routine x 2)     Status: None (Preliminary result)   Collection Time: 04/08/18 10:20 PM  Result Value Ref Range Status   Specimen Description BLOOD RIGHT ANTECUBITAL  Final   Special Requests   Final    BOTTLES DRAWN AEROBIC AND ANAEROBIC Blood Culture results may not be optimal due to an inadequate volume of blood received in culture bottles   Culture   Final    NO GROWTH 2 DAYS Performed at [facility], [address] [place], [place] [address]    Report Status PENDING  Incomplete  MRSA PCR Screening     Status: None   Collection Time: 04/09/18  1:57 AM  Result Value Ref Range Status   MRSA by PCR NEGATIVE NEGATIVE Final    Comment:        The GeneXpert MRSA Assay (FDA approved for NASAL specimens only), is one component of a comprehensive MRSA colonization surveillance program. It is not intended to diagnose MRSA infection nor to guide or monitor treatment for MRSA infections. Performed at [facility], [address] [place], [place] [address]     Procedures and diagnostic studies:  Dg Chest 2 View  Result Date: 04/08/2018 CLINICAL DATA:  Pt BIB [facility] from [facility], staff noted pt has had decreased LOC since Thursday, hx dementia, CVA with deficits on the left. At baseline pt able to care for herself with assistance. At this time, pt answering questions appropriately EXAM: CHEST - 2 VIEW COMPARISON:  none FINDINGS: Low lung volumes. Suspect central pulmonary vascular congestion. Consolidation/atelectasis in the lung bases, left greater than right. Cardiomegaly. Left subclavian  transvenous pacemaker. Aortic Atherosclerosis (ICD10-170.0). Moderate left and smaller right pleural effusions.  No pneumothorax. DJD in the left shoulder.  Hiatal hernia. IMPRESSION:  1. Cardiomegaly, pleural effusions, and central pulmonary vascularity suggesting CHF. 2. Bibasilar consolidation/atelectasis, left greater than right. Electronically Signed   By: Corlis Leak M.D.   On: 04/08/2018 22:07   Ct Head Wo Contrast  Result Date: 04/08/2018 CLINICAL DATA:  Patient with decreased level of consciousness. EXAM: CT HEAD WITHOUT CONTRAST TECHNIQUE: Contiguous axial images were obtained from the base of the skull through the vertex without intravenous contrast. COMPARISON:  None. FINDINGS: Brain: Patchy hypodensity within the left basal ganglia (image 15; series 3). Ventricles and sulci are prominent compatible with atrophy. No evidence for intracranial hemorrhage, mass lesion or mass-effect. Vascular: Internal carotid arterial vascular calcifications. Skull: Intact. Sinuses/Orbits: Paranasal sinuses are well aerated. Mastoid air cells are unremarkable. Orbits unremarkable. Other: None. IMPRESSION: 1. Hypodensity within the left basal ganglia favored to represent subacute to chronic left basal ganglia infarct. Electronically Signed   By: Annia Belt M.D.   On: 04/08/2018 22:40    Medications:   . aspirin EC  81 mg Oral Daily  . atorvastatin  20 mg Oral q1800  . barrier cream  1 application Topical TID  . carvedilol  12.5 mg Oral BID WC  . cinacalcet  30 mg Oral BID WC  . colchicine  0.3 mg Oral Q12H  . furosemide  40 mg Intravenous BID  . gabapentin  200 mg Oral BID WC  . gabapentin  300 mg Oral QHS  . heparin  5,000 Units Subcutaneous Q8H  . insulin aspart  0-5 Units Subcutaneous QHS  . insulin aspart  0-9 Units Subcutaneous TID WC  . isosorbide mononitrate  30 mg Oral Daily  . levothyroxine  50 mcg Oral QAC breakfast  . sodium chloride flush  3 mL Intravenous Q12H  . sodium chloride flush  3  mL Intravenous Q12H  . venlafaxine XR  75 mg Oral Q breakfast   Continuous Infusions: . sodium chloride       LOS: 0 days   Marlin Canary DO [facility]     *Please refer to [email or web address], password [email or web address] to get updated schedule on who will round on this patient, as hospitalists switch teams weekly. If 7PM-7AM, please contact night-coverage at [email or web address], password [email or web address] for any overnight needs.  04/10/2018, 11:44 AM

## 2018-04-11 ENCOUNTER — Inpatient Hospital Stay (HOSPITAL_COMMUNITY): Payer: Medicare Other

## 2018-04-11 LAB — GLUCOSE, CAPILLARY
Glucose-Capillary: 109 mg/dL — ABNORMAL HIGH (ref 70–99)
Glucose-Capillary: 179 mg/dL — ABNORMAL HIGH (ref 70–99)
Glucose-Capillary: 148 mg/dL — ABNORMAL HIGH (ref 70–99)
Glucose-Capillary: 101 mg/dL — ABNORMAL HIGH (ref 70–99)

## 2018-04-11 LAB — CBC
HCT: 35.5 % — ABNORMAL LOW (ref 36.0–46.0)
HEMOGLOBIN: 10.5 g/dL — AB (ref 12.0–15.0)
MCH: 27.8 pg (ref 26.0–34.0)
MCHC: 29.6 g/dL — ABNORMAL LOW (ref 30.0–36.0)
MCV: 93.9 fL (ref 78.0–100.0)
Platelets: 244 10*3/uL (ref 150–400)
RBC: 3.78 MIL/uL — AB (ref 3.87–5.11)
RDW: 16.1 % — ABNORMAL HIGH (ref 11.5–15.5)
WBC: 8.4 10*3/uL (ref 4.0–10.5)

## 2018-04-11 LAB — BASIC METABOLIC PANEL
ANION GAP: 12 (ref 5–15)
BUN: 36 mg/dL — ABNORMAL HIGH (ref 8–23)
CO2: 25 mmol/L (ref 22–32)
Calcium: 6.9 mg/dL — ABNORMAL LOW (ref 8.9–10.3)
Chloride: 99 mmol/L (ref 98–111)
Creatinine, Ser: 1.5 mg/dL — ABNORMAL HIGH (ref 0.44–1.00)
GFR calc Af Amer: 35 mL/min — ABNORMAL LOW (ref >60)
GFR, EST NON AFRICAN AMERICAN: 30 mL/min — AB (ref >60)
GLUCOSE: 135 mg/dL — AB (ref 70–99)
POTASSIUM: 5 mmol/L (ref 3.5–5.1)
Sodium: 136 mmol/L (ref 135–145)

## 2018-04-11 LAB — BRAIN NATRIURETIC PEPTIDE

## 2018-04-11 LAB — T3, FREE: T3, Free: UNDETERMINED pg/mL

## 2018-04-11 LAB — MAGNESIUM: MAGNESIUM: 1.3 mg/dL — AB (ref 1.7–2.4)

## 2018-04-11 MED ORDER — LACTATED RINGERS IV SOLN
INTRAVENOUS | Status: DC
  Administered 2018-04-11 – 2018-04-12 (×2): via INTRAVENOUS

## 2018-04-11 MED ORDER — SODIUM POLYSTYRENE SULFONATE PO POWD
30.0000 g | Freq: Once | ORAL | Status: AC
  Administered 2018-04-11: 30 g via ORAL
  Filled 2018-04-11: qty 30

## 2018-04-11 MED ORDER — SODIUM POLYSTYRENE SULFONATE 15 GM/60ML PO SUSP
30.0000 g | Freq: Once | ORAL | Status: DC
  Filled 2018-04-11: qty 120

## 2018-04-11 MED ORDER — CARVEDILOL 3.125 MG PO TABS
3.1250 mg | ORAL_TABLET | Freq: Two times a day (BID) | ORAL | Status: DC
  Administered 2018-04-12 – 2018-04-13 (×4): 3.125 mg via ORAL
  Filled 2018-04-11 (×4): qty 1

## 2018-04-11 MED ORDER — SODIUM CHLORIDE 0.9 % IV BOLUS
250.0000 mL | Freq: Once | INTRAVENOUS | Status: AC
  Administered 2018-04-11: 250 mL via INTRAVENOUS

## 2018-04-11 NOTE — Progress Notes (Addendum)
Progress Note    Tina Waters  ZOX:[id number] DOB: 1928-02-01  DOA: 04/08/2018 PCP: Tina Northern, MD    Brief Narrative:   Chief complaint: worsening left-sided weakness, lethargy and SOB  Medical records reviewed and are as summarized below:  Tina Waters is an 84 y.o. female with pmh hypothyroidism, chronic systolic HF (no previous EF on file), DM2, hx CVA with residual left hemiparesis admitted on 9/1 after presented from SNF with weakness, lethargy and sob. There was also mention of transient dysarthria that resolved upon arrival to ED. Prior to this admission, pt felt to be dehydrated at SNF and given continuous IVFs.    On arrival to ED, pt tachypneic, O2 sats low 90's on RA, cxr c/w CHG. CMET notable for K+ 5.9, Cr 1.49, lactic acid 3.2. Pt initally placed on empiric Levaquin for pna that was d/c'd on 9/1 with no evidence of infection. In regards to left hemiparesis, initial CT showed subacute vs chronic left basal ganglia infarct. Pt symptoms chronic, but children felt weakness more notable. Unable to obtain MRI as pt has pacemaker.   Assessment/Plan:    1. Acute encephalophathy; transiet dysarthria with question of worsening left sided weakness  - -chronic left hemiplegia with hx of CVA, CT head x2 nonacute, MRI cannot be done due to defibrillator/pacemaker.  Continue aspirin and statin for secondary prevention along with supportive care.  Seen by PT-OT and speech.  Encephalopathy was likely metabolic due to dehydration has resolved.  2. Chronic systolic HF/grade I diastolic dysfunction - EF 25% on echocardiogram done this admission AICD.  suspected dehydration at SNF, currently appears dehydrated, renal function worsening, hold further Lasix gently hydrate, continue combination of Coreg and Imdur for now.  Due to renal insufficiency no ACE inhibitor or ARB.  Records from [place] awaited.  3. Acute kidney injury, mild -underlying CKD 3 baseline creatinine around 1.1.  Now likely  dehydrated, hold ARB, Aldactone and Lasix, gently hydrate and repeat BMP tomorrow.  Low-dose Kayexalate for top normal potassium.  4. Hyperkalemia - #3 above.  5. Hx of CVA with chronic left-sided deficits  -  patient thinks she is at her baseline, no dysarthria which was reported upon admission, CT head x2 unremarkable, continue aspirin and statin for secondary prevention, no acute issues.  Cannot do MRI due to defibrillator.    6. IDDM  -  A1C 7.3%, Continue SSI. Holding Metformin with above issues. Consider restarting Levemir in am if continues with good po intake. Reasonable control for now  7. Hypothyroidism  -TSH 12.56, free T4 0.92, -Synthroid listed on med rec. For unknown reason, pt initially started on 12.54mcg on admit. SNF is unable to verify what dose pt was on prior to admit as she has been "discharged from their system" Continue for now. Would recommend recheck weeks at SNF.  8. Elevated troponin, mild  - -0.11>>0.12>>0.14.  Rise virtually flat and and non-ACS pattern, she had no chest pain, this could have been due to-continue ASA, imdur, BB, statin, no acute issues, likely is due to underlying CHF now ARF, echo noted with diffuse hypokinesis, she is a poor candidate for invasive procedure or testing due to her advanced age, poor functional status from previous CVA and left-sided hemiparesis, continue aspirin statin and beta-blocker for secondary prevention.  Outpatient cardiology follow-up.        Addendum paged at 5:30 PM.  Patient slightly more sleepy.  Blood pressure borderline low.  Hold Coreg tonight and lower the dose  from tomorrow, gentle bolus, discontinue Neurontin, Venlafaxine, Zanaflex and any narcotics.    Family Communication/Anticipated D/C date and plan/Code Status   DVT prophylaxis: SQ heparin Code Status: Full Code.  Family Communication: at bedside Disposition Plan: diurese another day   Medical Consultants:    None.   Anti-Infectives:     Levaquin- now d/c'd  Subjective:   Patient in bed, appears comfortable, denies any headache, no fever, no chest pain or pressure, no shortness of breath , no abdominal pain. No focal weakness.   Objective:    Vitals:   04/10/18 1942 04/11/18 0619 04/11/18 0621 04/11/18 1149  BP: 108/62  119/76 116/64  Pulse: 61  60 (!) 59  Resp: 18  20 16   Temp: 98.3 F (36.8 C)  97.7 F (36.5 C) 98 F (36.7 C)  TempSrc: Oral  Oral Oral  SpO2: 96%  95% 96%  Weight:  82.4 kg    Height:        Intake/Output Summary (Last 24 hours) at 04/11/2018 1336 Last data filed at 04/11/2018 1000 Gross per 24 hour  Intake 843 ml  Output 601 ml  Net 242 ml   Filed Weights   04/09/18 0213 04/10/18 0500 04/11/18 0619  Weight: 84.6 kg 83.3 kg 82.4 kg    Exam:  Awake , oriented x 2, old dense left-sided hemiparesis, pleasant affect Media.AT,PERRAL Supple Neck,No JVD, No cervical lymphadenopathy appriciated.  Symmetrical Chest wall movement, Good air movement bilaterally, CTAB RRR,No Gallops, Rubs or new Murmurs, No Parasternal Heave +ve B.Sounds, Abd Soft, No tenderness, No organomegaly appriciated, No rebound - guarding or rigidity. No Cyanosis, Clubbing or edema, No new Rash or bruise   Data Reviewed:   I have personally reviewed following labs and imaging studies:  Labs: Labs show the following:   Basic Metabolic Panel: Recent Labs  Lab 04/08/18 2115 04/09/18 0646 04/10/18 0432 04/11/18 0724  NA 138 138 140 136  K 5.9* 5.6* 4.5 5.0  CL 101 104 102 99  CO2 26 19* 27 25  GLUCOSE 149* 115* 129* 135*  BUN 34* 34* 32* 36*  CREATININE 1.49* 1.44* 1.48* 1.50*  CALCIUM 7.4* 7.0* 7.2* 6.9*  MG  --   --   --  1.3*   GFR Estimated Creatinine Clearance: 26.3 mL/min (A) (by C-G formula based on SCr of 1.5 mg/dL (H)). Liver Function Tests: Recent Labs  Lab 04/08/18 2115  AST 21  ALT 8  ALKPHOS 34*  BILITOT 0.6  PROT 7.4  ALBUMIN 3.4*   No results for input(s): LIPASE, AMYLASE in  the last 168 hours. No results for input(s): AMMONIA in the last 168 hours. Coagulation profile Recent Labs  Lab 04/08/18 2115  INR 1.35    CBC: Recent Labs  Lab 04/08/18 2115 04/09/18 0646 04/10/18 0432 04/11/18 0724  WBC 7.6 7.8 7.7 8.4  NEUTROABS 5.1  --   --   --   HGB 11.9* 10.5* 10.3* 10.5*  HCT 39.0 35.3* 33.9* 35.5*  MCV 93.8 94.1 93.4 93.9  PLT 269 266 258 244   Cardiac Enzymes: Recent Labs  Lab 04/09/18 0017 04/09/18 0646 04/09/18 1219  TROPONINI 0.11* 0.12* 0.14*   BNP (last 3 results) No results for input(s): PROBNP in the last 8760 hours. CBG: Recent Labs  Lab 04/10/18 1220 04/10/18 1635 04/10/18 2128 04/11/18 0746 04/11/18 1147  GLUCAP 132* 143* 157* 109* 179*   D-Dimer: No results for input(s): DDIMER in the last 72 hours. Hgb A1c: Recent Labs  04/09/18 0646  HGBA1C 7.3*   Lipid Profile: Recent Labs    04/09/18 0646  CHOL 92  HDL 32*  LDLCALC 42  TRIG 89  CHOLHDL 2.9   Thyroid function studies: Recent Labs    04/09/18 0017  TSH 12.563*   Anemia work up: No results for input(s): VITAMINB12, FOLATE, FERRITIN, TIBC, IRON, RETICCTPCT in the last 72 hours. Sepsis Labs: Recent Labs  Lab 04/08/18 2044 04/08/18 2115 04/08/18 2245 04/09/18 0033 04/09/18 0308 04/09/18 0646 04/10/18 0432 04/11/18 0724  WBC  --  7.6  --   --   --  7.8 7.7 8.4  LATICACIDVEN 3.20*  --  2.94* 2.99* 2.6*  --   --   --     Microbiology Recent Results (from the past 240 hour(s))  Urine culture     Status: None   Collection Time: 04/08/18  8:29 PM  Result Value Ref Range Status   Specimen Description URINE, RANDOM  Final   Special Requests NONE  Final   Culture   Final    NO GROWTH Performed at [facility], [address] [place], [place] [address]    Report Status 04/10/2018 FINAL  Final  Culture, blood (Routine x 2)     Status: None (Preliminary result)   Collection Time: 04/08/18  9:15 PM  Result Value Ref Range Status    Specimen Description BLOOD BLOOD RIGHT FOREARM  Final   Special Requests   Final    BOTTLES DRAWN AEROBIC AND ANAEROBIC Blood Culture adequate volume   Culture   Final    NO GROWTH 2 DAYS Performed at [facility], [address], [place], [place] [address]    Report Status PENDING  Incomplete  Culture, blood (Routine x 2)     Status: None (Preliminary result)   Collection Time: 04/08/18 10:20 PM  Result Value Ref Range Status   Specimen Description BLOOD RIGHT ANTECUBITAL  Final   Special Requests   Final    BOTTLES DRAWN AEROBIC AND ANAEROBIC Blood Culture results may not be optimal due to an inadequate volume of blood received in culture bottles   Culture   Final    NO GROWTH 2 DAYS Performed at [facility], [address], [place], [place] [address]    Report Status PENDING  Incomplete  MRSA PCR Screening     Status: None   Collection Time: 04/09/18  1:57 AM  Result Value Ref Range Status   MRSA by PCR NEGATIVE NEGATIVE Final    Comment:        The GeneXpert MRSA Assay (FDA approved for NASAL specimens only), is one component of a comprehensive MRSA colonization surveillance program. It is not intended to diagnose MRSA infection nor to guide or monitor treatment for MRSA infections. Performed at [facility], [address] [place], [place] [address]     Procedures and diagnostic studies:  Ct Head Wo Contrast  Result Date: 04/10/2018 CLINICAL DATA:  84 year old female with new left basal ganglia infarct. Follow-up CT scan prior to discharge. EXAM: CT HEAD WITHOUT CONTRAST TECHNIQUE: Contiguous axial images were obtained from the base of the skull through the vertex without intravenous contrast. COMPARISON:  Prior head CT 04/08/2018 FINDINGS: Brain: No evidence of hemorrhagic transformation. Expected evolution of ill-defined low-attenuation in the inferior left basal ganglia and subinsular cortex. Stable cortical atrophy and chronic microvascular  ischemic white matter disease. Vascular: No hyperdense vessel or unexpected calcification. Bilateral cavernous and supraclinoid  carotid artery atherosclerotic calcifications. Skull: Normal. Negative for fracture or focal lesion. Sinuses/Orbits: No acute finding. Other: None. IMPRESSION: 1. No new acute intracranial abnormality or evidence of hemorrhagic conversion. 2. Expected evolution of left inferior basal ganglia and subinsular cortical infarct. Electronically Signed   By: Malachy Moan M.D.   On: 04/10/2018 12:12    Medications:   . aspirin EC  81 mg Oral Daily  . atorvastatin  20 mg Oral q1800  . barrier cream  1 application Topical TID  . carvedilol  12.5 mg Oral BID WC  . cinacalcet  30 mg Oral BID WC  . gabapentin  200 mg Oral BID WC  . gabapentin  300 mg Oral QHS  . heparin  5,000 Units Subcutaneous Q8H  . insulin aspart  0-5 Units Subcutaneous QHS  . insulin aspart  0-9 Units Subcutaneous TID WC  . isosorbide mononitrate  30 mg Oral Daily  . levothyroxine  50 mcg Oral QAC breakfast  . sodium chloride flush  3 mL Intravenous Q12H  . sodium polystyrene  30 g Oral Once  . venlafaxine XR  75 mg Oral Q breakfast   Continuous Infusions: . lactated ringers       LOS: 1 day   Signature  Susa Raring M.D on 04/11/2018 at 1:36 PM  To page go to [email or web address] - password [email or web address]

## 2018-04-11 NOTE — Progress Notes (Signed)
Pt sleepy, will wake up and talk to me , able to follow commands, bp 102/70 , pt denies pain, paged Dr Thedore Mins, will hold

## 2018-04-11 NOTE — Clinical Social Work Note (Signed)
Patient is a long-term resident at [facility]. No supports at bedside. Left voicemail for daughter, Lauro Regulus. Will complete assessment when she returns call.  Charlynn Court, CSW [phone]

## 2018-04-12 DIAGNOSIS — I5043 Acute on chronic combined systolic (congestive) and diastolic (congestive) heart failure: Secondary | ICD-10-CM

## 2018-04-12 DIAGNOSIS — G9340 Encephalopathy, unspecified: Secondary | ICD-10-CM

## 2018-04-12 DIAGNOSIS — N189 Chronic kidney disease, unspecified: Secondary | ICD-10-CM

## 2018-04-12 DIAGNOSIS — N179 Acute kidney failure, unspecified: Secondary | ICD-10-CM

## 2018-04-12 LAB — GLUCOSE, CAPILLARY
GLUCOSE-CAPILLARY: 131 mg/dL — AB (ref 70–99)
GLUCOSE-CAPILLARY: 150 mg/dL — AB (ref 70–99)
GLUCOSE-CAPILLARY: 150 mg/dL — AB (ref 70–99)
GLUCOSE-CAPILLARY: 157 mg/dL — AB (ref 70–99)
Glucose-Capillary: 154 mg/dL — ABNORMAL HIGH (ref 70–99)
Glucose-Capillary: 186 mg/dL — ABNORMAL HIGH (ref 70–99)

## 2018-04-12 LAB — BASIC METABOLIC PANEL
ANION GAP: 12 (ref 5–15)
BUN: 33 mg/dL — ABNORMAL HIGH (ref 8–23)
CO2: 26 mmol/L (ref 22–32)
Calcium: 6.9 mg/dL — ABNORMAL LOW (ref 8.9–10.3)
Chloride: 99 mmol/L (ref 98–111)
Creatinine, Ser: 1.28 mg/dL — ABNORMAL HIGH (ref 0.44–1.00)
GFR calc Af Amer: 42 mL/min — ABNORMAL LOW (ref >60)
GFR, EST NON AFRICAN AMERICAN: 36 mL/min — AB (ref >60)
Glucose, Bld: 154 mg/dL — ABNORMAL HIGH (ref 70–99)
POTASSIUM: 3.5 mmol/L (ref 3.5–5.1)
SODIUM: 137 mmol/L (ref 135–145)

## 2018-04-12 LAB — MAGNESIUM: MAGNESIUM: 1.2 mg/dL — AB (ref 1.7–2.4)

## 2018-04-12 MED ORDER — INFLUENZA VAC SPLIT HIGH-DOSE 0.5 ML IM SUSY
0.5000 mL | PREFILLED_SYRINGE | INTRAMUSCULAR | Status: AC
  Administered 2018-04-13: 0.5 mL via INTRAMUSCULAR
  Filled 2018-04-12: qty 0.5

## 2018-04-12 NOTE — Progress Notes (Signed)
Given MEWS score hx, pts initial presentation will re-order cardiac monitoring order per CCMD stating it is not active any longer.

## 2018-04-12 NOTE — Progress Notes (Signed)
  Vital Signs MEWS/VS Documentation      04/12/2018 0916 04/12/2018 1159 04/12/2018 1700 04/12/2018 1850   MEWS Score:  3  1  1  1    MEWS Score Color:  Yellow  Green  Green  Green   Resp:  20  19  -  18   Pulse:  (!) 125  67  60  63   BP:  113/84  106/75  111/73  110/72   Temp:  98.5 F (36.9 C)  99 F (37.2 C)  -  98.3 F (36.8 C)   O2 Device:  Room [employer]  -  Room Air      Farnham N Tobias-Diakun 04/12/2018,6:50 PM

## 2018-04-12 NOTE — Progress Notes (Signed)
   04/12/18 0508  MEWS Score  Resp 20  Pulse Rate (!) 123  BP 93/78  Temp 98.1 F (36.7 C)  MEWS RR 0  MEWS Pulse 2  MEWS Systolic 1  MEWS LOC 1  MEWS Temp 0  MEWS Score 4  MEWS Score Color Red     Vital Signs MEWS/VS Documentation      04/11/2018 1719 04/11/2018 1949 04/11/2018 2000 04/12/2018 0508   MEWS Score:  0  1  2  4    MEWS Score Color:  Green  Green  Yellow  Red   Resp:  -  20  -  20   Pulse:  (!) 54  (!) 58  -  (!) 123   BP:  102/61  (!) 93/50  -  93/78   Temp:  -  98.5 F (36.9 C)  -  98.1 F (36.7 C)   O2 Device:  -  Room Air  -  Room Air   Level of Consciousness:  -  -  Responds to Voice  Luanna Salk 04/12/2018,8:12 AM

## 2018-04-12 NOTE — Progress Notes (Signed)
Page to mid-level  3e19 pt denies complaints, family noted pt was clammy. VS WNL, EKG performed as precaution. all in chart. cbg ok with dinner, then patient ate.

## 2018-04-12 NOTE — Progress Notes (Signed)
  Vital Signs MEWS/VS Documentation      04/11/2018 2000 04/12/2018 0508 04/12/2018 0841 04/12/2018 0916   MEWS Score:  2  4  4  3    MEWS Score Color:  Yellow  Red  Red  Yellow   Resp:  -  20  -  20   Pulse:  -  (!) 123  (!) 130  (!) 125   BP:  -  93/78  102/86  113/84   Temp:  -  98.1 F (36.7 C)  -  98.5 F (36.9 C)   O2 Device:  -  Room Air  -  Room Air   Level of Consciousness:  Responds to Voice  -  -  Tina Waters 04/12/2018,9:35 AM

## 2018-04-12 NOTE — Progress Notes (Signed)
  Vital Signs MEWS/VS Documentation      04/11/2018 1719 04/11/2018 1949 04/11/2018 2000 04/12/2018 0508   MEWS Score:  0  1  2  4    MEWS Score Color:  Green  Green  Yellow  Red   Resp:  -  20  -  20   Pulse:  (!) 54  (!) 58  -  (!) 123   BP:  102/61  (!) 93/50  -  93/78   Temp:  -  98.5 F (36.9 C)  -  98.1 F (36.7 C)   O2 Device:  -  Room Air  -  Room Air   Level of Consciousness:  -  -  Responds to Voice  -      Patient at baseline.     Tina Waters 04/12/2018,6:57 AM

## 2018-04-12 NOTE — Progress Notes (Signed)
PROGRESS NOTE   Tina Waters  BJY:[id number]    DOB: 06-Feb-1928    DOA: 04/08/2018  PCP: Eloisa Northern, MD   I have briefly reviewed patients previous medical records in [facility].  Brief Narrative:  84 year old female with PMH of hypothyroid, chronic systolic CHF (no prior EF on file on admission), DM 2, CVA with residual left hemiparesis, admitted on 9/1 after she presented from SNF with complaints of weakness, lethargy, dyspnea and reported transient dysarthria that resolved upon arrival to ED.  Prior to the admission, she was felt to be dehydrated at SNF and was given IV fluids.  Noted to be tachypneic, hypoxic in the 90s on room air, chest x-ray consistent with CHF, lab work showed mild hyperkalemia, creatinine of 1.49.  Initial CT head showed subacute versus chronic left basal ganglia infarct.  Unable to obtain MRI since patient has pacemaker.  Assessment & Plan:   Principal Problem:   Acute on chronic systolic CHF (congestive heart failure) (HCC) Active Problems:   Hypothyroidism   Insulin-requiring or dependent type II diabetes mellitus (HCC)   AKI (acute kidney injury) (HCC)   Acute encephalopathy   History of CVA with residual deficit   Elevated troponin   Hyperkalemia   Elevated lactic acid level   Transient speech disturbance   Acute encephalopathy/transient dysarthria and question of worsening left-sided weakness: Patient has history of CVA with chronic left hemiplegia.  CT head x2 were nonacute.  MRI brain could not be done due to pacemaker.  Continued on aspirin and statin for secondary stroke prevention.  Mental status changes felt to be likely due to medications and dehydration which seems to have resolved.  Neurontin, venlafaxine, Zanaflex and narcotics held.  Acute on chronic combined systolic and diastolic CHF: TTE 9/1: LVEF 20-25% and grade 1 diastolic dysfunction.  Suspected dehydration at SNF.  Appeared dehydrated here as well with associated worsening  creatinine.  Lasix was held and she was gently hydrated.  Appears compensated.  Continue carvedilol and Imdur.  No ACEI or ARB due to renal insufficiency.  Acute on stage III chronic kidney disease: Baseline creatinine reportedly 1.1.  Creatinine peaked to 1.5 on 9/3.  Lasix, ARB, Aldactone held.  Briefly hydrated with IV fluids.  Creatinine has improved to 1.28.  DC IV fluids and follow BMP in a.m.  Hyperkalemia: Resolved.  History of CVA with residual left hemiplegia: Discussion as indicated above.  Type II DM/IDDM: A1c 7.3.  Reasonable inpatient control.  Holding metformin due to renal insufficiency.  Hypothyroid: TSH 12.56, free T4: 0.92.  As per previous notes, Synthroid 50 mcg listed on med rec but SNF unable to verify what dose patient was on prior to admit.  Continue current dose of Synthroid and will need to recheck TSH in 4 weeks.  Elevated troponin: Mild and flat trend.  No chest pain reported.  Could be due to renal insufficiency.  Continue aspirin, Imdur, beta-blocker and statins.  As per previous notes, poor candidate for invasive testing or intervention given her advanced age, poor baseline functional status from prior CVA and left-sided hemiplegia.  Outpatient cardiology follow-up.  Anemia of chronic disease: Stable.  Hypomagnesemia: Check magnesium and replace as needed.   DVT prophylaxis: Subcutaneous heparin. Code Status: Full Family Communication: None at bedside Disposition: DC to SNF pending clinical improvement   Consultants:  None  Procedures:  None  Antimicrobials:  None   Subjective: Content reviewed and examined along with her RN.  Patient states that she feels "okay".  She is oriented to person, place and partly to time.  She denies complaints.  She denies dyspnea, pain.  As per RN, mental status has significantly improved and no acute issues noted.  ROS: As above, otherwise negative.  Objective:  Vitals:   04/12/18 0916 04/12/18 1159 04/12/18  1700 04/12/18 1850  BP: 113/84 106/75 111/73 110/72  Pulse: (!) 125 67 60 63  Resp: 20 19  18   Temp: 98.5 F (36.9 C) 99 F (37.2 C)  98.3 F (36.8 C)  TempSrc: Oral Oral  Oral  SpO2: 94% 96%  100%  Weight:      Height:        Examination:  General exam: Pleasant elderly female, moderately built and obese, lying comfortably propped up in bed. Respiratory system: Clear to auscultation. Respiratory effort normal. Cardiovascular system: S1 & S2 heard, RRR. No JVD, murmurs, rubs, gallops or clicks.  Trace ankle edema.  Personally reviewed: AV paced rhythm.  Intermittent sinus tachycardia. Gastrointestinal system: Abdomen is nondistended, soft and nontender. No organomegaly or masses felt. Normal bowel sounds heard. Central nervous system: Mental status as indicated above. No cranial nerve deficits noted.  No dysarthria or facial asymmetry. Extremities: Grade 5 x 5 power in right limbs and grade 2 to 3 x 5 power left limbs. Skin: No rashes, lesions or ulcers Psychiatry: Judgement and insight appear impaired. Mood & affect appropriate.     Data Reviewed: I have personally reviewed following labs and imaging studies  CBC: Recent Labs  Lab 04/08/18 2115 04/09/18 0646 04/10/18 0432 04/11/18 0724  WBC 7.6 7.8 7.7 8.4  NEUTROABS 5.1  --   --   --   HGB 11.9* 10.5* 10.3* 10.5*  HCT 39.0 35.3* 33.9* 35.5*  MCV 93.8 94.1 93.4 93.9  PLT 269 266 258 244   Basic Metabolic Panel: Recent Labs  Lab 04/08/18 2115 04/09/18 0646 04/10/18 0432 04/11/18 0724 04/12/18 0441  NA 138 138 140 136 137  K 5.9* 5.6* 4.5 5.0 3.5  CL 101 104 102 99 99  CO2 26 19* 27 25 26   GLUCOSE 149* 115* 129* 135* 154*  BUN 34* 34* 32* 36* 33*  CREATININE 1.49* 1.44* 1.48* 1.50* 1.28*  CALCIUM 7.4* 7.0* 7.2* 6.9* 6.9*  MG  --   --   --  1.3*  --    Liver Function Tests: Recent Labs  Lab 04/08/18 2115  AST 21  ALT 8  ALKPHOS 34*  BILITOT 0.6  PROT 7.4  ALBUMIN 3.4*   Coagulation  Profile: Recent Labs  Lab 04/08/18 2115  INR 1.35   Cardiac Enzymes: Recent Labs  Lab 04/09/18 0017 04/09/18 0646 04/09/18 1219  TROPONINI 0.11* 0.12* 0.14*   HbA1C: No results for input(s): HGBA1C in the last 72 hours. CBG: Recent Labs  Lab 04/11/18 2125 04/12/18 0723 04/12/18 1152 04/12/18 1201 04/12/18 1633  GLUCAP 101* 131* 154* 150* 150*    Recent Results (from the past 240 hour(s))  Urine culture     Status: None   Collection Time: 04/08/18  8:29 PM  Result Value Ref Range Status   Specimen Description URINE, RANDOM  Final   Special Requests NONE  Final   Culture   Final    NO GROWTH Performed at [facility], [address] [place], [place] [address]    Report Status 04/10/2018 FINAL  Final  Culture, blood (Routine x 2)     Status: None (Preliminary result)   Collection Time: 04/08/18  9:15 PM  Result Value Ref Range Status   Specimen Description BLOOD BLOOD RIGHT FOREARM  Final   Special Requests   Final    BOTTLES DRAWN AEROBIC AND ANAEROBIC Blood Culture adequate volume   Culture   Final    NO GROWTH 4 DAYS Performed at [facility], [address] [place], [place] [address]    Report Status PENDING  Incomplete  Culture, blood (Routine x 2)     Status: None (Preliminary result)   Collection Time: 04/08/18 10:20 PM  Result Value Ref Range Status   Specimen Description BLOOD RIGHT ANTECUBITAL  Final   Special Requests   Final    BOTTLES DRAWN AEROBIC AND ANAEROBIC Blood Culture results may not be optimal due to an inadequate volume of blood received in culture bottles   Culture   Final    NO GROWTH 4 DAYS Performed at [facility], [address] [place], [place] [address]    Report Status PENDING  Incomplete  MRSA PCR Screening     Status: None   Collection Time: 04/09/18  1:57 AM  Result Value Ref Range Status   MRSA by PCR NEGATIVE NEGATIVE Final    Comment:        The GeneXpert MRSA Assay (FDA approved for NASAL  specimens only), is one component of a comprehensive MRSA colonization surveillance program. It is not intended to diagnose MRSA infection nor to guide or monitor treatment for MRSA infections. Performed at [facility], [address], [place], [place] [address]          Radiology Studies: Dg Chest Port 1 View  Result Date: 04/11/2018 CLINICAL DATA:  Short of breath EXAM: PORTABLE CHEST 1 VIEW COMPARISON:  04/08/2018 FINDINGS: Left-sided pacing device as before. Cardiomegaly with vascular congestion and mild pulmonary edema. Bilateral pleural effusions. Bibasilar consolidations. Moderate to large hiatal hernia. Aortic atherosclerosis. No pneumothorax. Right shoulder deformity with discontinuity at the [facility] joint, humeral head deformity, and subluxed appearance of the right humeral head. IMPRESSION: 1. Cardiomegaly with vascular congestion and pulmonary edema, not significantly changed. 2. Moderate pleural effusions and bibasilar consolidations, unchanged 3. Large hiatal hernia Electronically Signed   By: Jasmine Pang M.D.   On: 04/11/2018 14:13        Scheduled Meds: . aspirin EC  81 mg Oral Daily  . atorvastatin  20 mg Oral q1800  . barrier cream  1 application Topical TID  . carvedilol  3.125 mg Oral BID WC  . cinacalcet  30 mg Oral BID WC  . heparin  5,000 Units Subcutaneous Q8H  . [START ON 04/13/2018] Influenza vac split quadrivalent PF  0.5 mL Intramuscular Tomorrow-1000  . insulin aspart  0-5 Units Subcutaneous QHS  . insulin aspart  0-9 Units Subcutaneous TID WC  . isosorbide mononitrate  30 mg Oral Daily  . levothyroxine  50 mcg Oral QAC breakfast  . sodium chloride flush  3 mL Intravenous Q12H   Continuous Infusions:   LOS: 2 days     Marcellus Scott, MD, FACP, [facility]. [facility] Pager [phone]  If 7PM-7AM, please contact night-coverage [email or web address] Password [email or web address] 04/12/2018, 7:12 PM

## 2018-04-12 NOTE — [place] FL2 (Signed)
[place] MEDICAID FL2 LEVEL OF CARE SCREENING TOOL     IDENTIFICATION  Patient Name: Tina Waters Birthdate: May 07, 1928 Sex: female Admission Date (Current Location): 04/08/2018  [facility] and [place] Number:  Producer, television/film/video and Address:  [facility], [address], [place], [place] [address]      Provider Number: [id number]  Attending Physician Name and Address:  Elease Etienne, MD  Relative Name and Phone Number:       Current Level of Care: Hospital Recommended Level of Care: Skilled Nursing Facility Prior Approval Number:    Date Approved/Denied:   PASRR Number: [id number]  Discharge Plan: SNF    Current Diagnoses: Patient Active Problem List   Diagnosis Date Noted  . Transient speech disturbance 04/09/2018  . Acute on chronic systolic CHF (congestive heart failure) (HCC) 04/08/2018  . Hypothyroidism 04/08/2018  . Insulin-requiring or dependent type II diabetes mellitus (HCC) 04/08/2018  . AKI (acute kidney injury) (HCC) 04/08/2018  . Acute encephalopathy 04/08/2018  . History of CVA with residual deficit 04/08/2018  . Elevated troponin 04/08/2018  . Hyperkalemia 04/08/2018  . Elevated lactic acid level 04/08/2018    Orientation RESPIRATION BLADDER Height & Weight     Self, Time, Situation, Place  Normal Incontinent, External catheter Weight: 183 lb 3.2 oz (83.1 kg) Height:  5\' 2"  (157.5 cm)  BEHAVIORAL SYMPTOMS/MOOD NEUROLOGICAL BOWEL NUTRITION STATUS  (None) (History of CVA) Continent Diet(Heart healthy/carb modified. Fluid restriction 1500 mL.)  AMBULATORY STATUS COMMUNICATION OF NEEDS Skin   Extensive Assist Verbally Other (Comment)(Excoriated)                       Personal Care Assistance Level of Assistance  Bathing, Feeding, Dressing Bathing Assistance: Maximum assistance Feeding assistance: Limited assistance Dressing Assistance: Maximum assistance     Functional Limitations Info  Sight, Hearing,  Speech Sight Info: Adequate Hearing Info: Adequate Speech Info: Adequate    SPECIAL CARE FACTORS FREQUENCY  PT (By licensed PT), OT (By licensed OT)     PT Frequency: 5 x week OT Frequency: 5 x week            Contractures Contractures Info: Not present    Additional Factors Info  Code Status, Allergies Code Status Info: Full Allergies Info: Penicillins           Current Medications (04/12/2018):  This is the current hospital active medication list Current Facility-Administered Medications  Medication Dose Route Frequency Provider Last Rate Last Dose  . acetaminophen (TYLENOL) tablet 650 mg  650 mg Oral Q6H PRN Opyd, Lavone Neri, MD      . aspirin EC tablet 81 mg  81 mg Oral Daily Opyd, Lavone Neri, MD   81 mg at 04/12/18 0957  . atorvastatin (LIPITOR) tablet 20 mg  20 mg Oral q1800 Opyd, Lavone Neri, MD   20 mg at 04/11/18 1715  . barrier cream (non-specified) 1 application  1 application Topical TID Opyd, Lavone Neri, MD   1 application at 04/12/18 0957  . carvedilol (COREG) tablet 3.125 mg  3.125 mg Oral BID WC Leroy Sea, MD   3.125 mg at 04/12/18 0841  . cinacalcet (SENSIPAR) tablet 30 mg  30 mg Oral BID WC Opyd, Lavone Neri, MD   30 mg at 04/12/18 0841  . heparin injection 5,000 Units  5,000 Units Subcutaneous Q8H Briscoe Deutscher, MD   5,000 Units at 04/12/18 0607  . insulin aspart (novoLOG) injection 0-5 Units  0-5 Units Subcutaneous QHS Opyd, Timothy S, MD      . insulin aspart (novoLOG) injection 0-9 Units  0-9 Units Subcutaneous TID WC Opyd, Lavone Neri, MD   1 Units at 04/12/18 0843  . isosorbide mononitrate (IMDUR) 24 hr tablet 30 mg  30 mg Oral Daily Opyd, Lavone Neri, MD   30 mg at 04/12/18 0957  . lactated ringers infusion   Intravenous Continuous Leroy Sea, MD 75 mL/hr at 04/12/18 0424    . levothyroxine (SYNTHROID, LEVOTHROID) tablet 50 mcg  50 mcg Oral QAC breakfast Gwenyth Bender, NP   50 mcg at 04/12/18 5462  . ondansetron (ZOFRAN) injection 4 mg  4 mg  Intravenous Q6H PRN Opyd, Lavone Neri, MD      . senna-docusate (Senokot-S) tablet 1 tablet  1 tablet Oral QHS PRN Opyd, Lavone Neri, MD      . sodium chloride flush (NS) 0.9 % injection 3 mL  3 mL Intravenous Q12H Opyd, Lavone Neri, MD   3 mL at 04/11/18 0913     Discharge Medications: Please see discharge summary for a list of discharge medications.  Relevant Imaging Results:  Relevant Lab Results:   Additional Information SS#: [id number]  Margarito Liner, LCSW

## 2018-04-12 NOTE — Clinical Social Work Note (Signed)
Clinical Social Work Assessment  Patient Details  Name: Tina Waters MRN: [id number] Date of Birth: [birth year]  Date of referral:  04/12/18               Reason for consult:  Discharge Planning                Permission sought to share information with:  Facility Sport and exercise psychologist, Family Supports Permission granted to share information::  Yes, Verbal Permission Granted  Name::     Tina Waters  Agency::  [facility]  Relationship::  Daughter  Contact Information:  [phone]  Housing/Transportation Living arrangements for the past 2 months:  [place] of Information:  Patient, Medical Team, Facility Patient Interpreter Needed:  None Criminal Activity/Legal Involvement Pertinent to Current Situation/Hospitalization:  No - Comment as needed Significant Relationships:  Adult Children, Other Family Members Lives with:  Facility Resident Do you feel safe going back to the place where you live?  Yes Need for family participation in patient care:  Yes (Comment)  Care giving concerns:  Patient is a long-term resident at [facility].   Social Worker assessment / plan:  CSW met with patient. No supports at bedside. CSW introduced role and explained that discharge planning would be discussed. Patient confirmed that she was admitted from [facility] and plans to return at discharge. She stated the best family member to contact is her daughter, Tina Waters. CSW tried calling her again. Did not leave a second voicemail. No further concerns. CSW encouraged patient to contact CSW as needed. CSW will continue to follow patient for support and facilitate discharge back to SNF once medically stable.  Employment status:  Retired Forensic scientist:  Medicare PT Recommendations:  [place] / Referral to community resources:  [place]  Patient/Family's Response to care:  Patient agreeable to return  to SNF. Patient's family supportive and involved in patient's care. Patient appreciated social work intervention.  Patient/Family's Understanding of and Emotional Response to Diagnosis, Current Treatment, and Prognosis:  Patient has a good understanding of the reason for admission and plan to return to SNF at discharge. Patient appears happy with hospital care.  Emotional Assessment Appearance:  Appears stated age Attitude/Demeanor/Rapport:  Engaged, Gracious Affect (typically observed):  Accepting, Appropriate, Calm, Pleasant Orientation:  Oriented to Self, Oriented to Place, Oriented to  Time, Oriented to Situation Alcohol / Substance use:  Never Used Psych involvement (Current and /or in the community):  No (Comment)  Discharge Needs  Concerns to be addressed:  Care Coordination Readmission within the last 30 days:  No Current discharge risk:  None Barriers to Discharge:  Continued Medical Work up   Candie Chroman, LCSW 04/12/2018, 10:23 AM

## 2018-04-12 NOTE — Progress Notes (Signed)
Patient sleeping during shift report.      

## 2018-04-12 NOTE — Progress Notes (Signed)
Page to MD   

## 2018-04-12 NOTE — Progress Notes (Signed)
Verified no receipt of fax related to patient's implant; per family they were told something was sent to [place].

## 2018-04-13 LAB — CULTURE, BLOOD (ROUTINE X 2)
CULTURE: NO GROWTH
Culture: NO GROWTH
SPECIAL REQUESTS: ADEQUATE

## 2018-04-13 LAB — BASIC METABOLIC PANEL
ANION GAP: 14 (ref 5–15)
BUN: 34 mg/dL — ABNORMAL HIGH (ref 8–23)
CALCIUM: 6.9 mg/dL — AB (ref 8.9–10.3)
CHLORIDE: 100 mmol/L (ref 98–111)
CO2: 23 mmol/L (ref 22–32)
Creatinine, Ser: 1.28 mg/dL — ABNORMAL HIGH (ref 0.44–1.00)
GFR calc Af Amer: 42 mL/min — ABNORMAL LOW (ref >60)
GFR calc non Af Amer: 36 mL/min — ABNORMAL LOW (ref >60)
Glucose, Bld: 162 mg/dL — ABNORMAL HIGH (ref 70–99)
POTASSIUM: 3.5 mmol/L (ref 3.5–5.1)
Sodium: 137 mmol/L (ref 135–145)

## 2018-04-13 LAB — GLUCOSE, CAPILLARY
GLUCOSE-CAPILLARY: 156 mg/dL — AB (ref 70–99)
GLUCOSE-CAPILLARY: 137 mg/dL — AB (ref 70–99)
Glucose-Capillary: 155 mg/dL — ABNORMAL HIGH (ref 70–99)
Glucose-Capillary: 124 mg/dL — ABNORMAL HIGH (ref 70–99)

## 2018-04-13 MED ORDER — MAGNESIUM SULFATE 4 GM/100ML IV SOLN
4.0000 g | Freq: Once | INTRAVENOUS | Status: AC
  Administered 2018-04-13: 4 g via INTRAVENOUS
  Filled 2018-04-13: qty 100

## 2018-04-13 MED ORDER — CARVEDILOL 6.25 MG PO TABS
6.2500 mg | ORAL_TABLET | Freq: Two times a day (BID) | ORAL | Status: DC
  Administered 2018-04-14: 6.25 mg via ORAL
  Filled 2018-04-13: qty 1

## 2018-04-13 MED ORDER — VITAMIN D 1000 UNITS PO TABS
1000.0000 [IU] | ORAL_TABLET | Freq: Every day | ORAL | Status: DC
  Administered 2018-04-14 – 2018-04-15 (×2): 1000 [IU] via ORAL
  Filled 2018-04-13 (×2): qty 1

## 2018-04-13 MED ORDER — FUROSEMIDE 20 MG PO TABS
20.0000 mg | ORAL_TABLET | Freq: Every day | ORAL | Status: DC
  Administered 2018-04-13 – 2018-04-14 (×2): 20 mg via ORAL
  Filled 2018-04-13 (×2): qty 1

## 2018-04-13 MED ORDER — PANTOPRAZOLE SODIUM 40 MG PO TBEC
40.0000 mg | DELAYED_RELEASE_TABLET | Freq: Every day | ORAL | Status: DC
  Administered 2018-04-13 – 2018-04-15 (×3): 40 mg via ORAL
  Filled 2018-04-13 (×3): qty 1

## 2018-04-13 MED ORDER — GLYCERIN-HYPROMELLOSE-PEG 400 0.2-0.2-1 % OP SOLN: 1.0000 [drp] | Freq: Two times a day (BID) | OPHTHALMIC | Status: DC | PRN

## 2018-04-13 NOTE — Care Management Important Message (Signed)
Important Message  Patient Details  Name: Deloros Tardiff MRN: [id number] Date of Birth: June 18, 1928   Medicare Important Message Given:  Yes    Stpehen Petitjean P Elham Fini 04/13/2018, 3:20 PM

## 2018-04-13 NOTE — Progress Notes (Addendum)
Pt is asleep. Pt awakes to voice and can follow commands. MD is aware that pt has been drowsy this morning. No new orders.

## 2018-04-13 NOTE — Progress Notes (Signed)
RN reheated pt's breakfast. Pt ate a few bites of her meal.

## 2018-04-13 NOTE — Progress Notes (Signed)
Patient stable, eating dinner with assistance from family at bedside. Received report from previous RN.

## 2018-04-13 NOTE — Progress Notes (Signed)
MEWS/VS Documentation       04/12/2018 1850 04/12/2018 1941 04/12/2018 2022 04/13/2018 0333   MEWS Score:  1  1  2  4    MEWS Score Color:  Green  Green  Yellow  Red   Resp:  18  --  (!) 22  18   Pulse:  63  --  60  (!) 130   BP:  110/72  --  102/63  108/81   Temp:  98.3 F (36.8 C)  99.7 F (37.6 C)  98.4 F (36.9 C)  98.2 F (36.8 C)   O2 Device:  Room Air  --  Room Air  Room Air     Pt's HR has been going up to the 140s non sustained with the range from 60s to 140s. Pt asymptomatic. Mg is 1.2. NP Schorr notified. Orders to administer Mg was put in. Will administer and continue to monitor.

## 2018-04-13 NOTE — Progress Notes (Deleted)
Pt is having multiple SVTs, HR goes from 60 - 140s, sometimes sustained. Mg is 1.2. NP on call was notified. Will continue to monitor.

## 2018-04-13 NOTE — Progress Notes (Addendum)
PROGRESS NOTE   Tina Waters  WUJ:[id number]    DOB: 17-Oct-1927    DOA: 04/08/2018  PCP: Eloisa Northern, MD   I have briefly reviewed patients previous medical records in [facility].  Brief Narrative:  84 year old female with PMH of hypothyroid, chronic systolic CHF (no prior EF on file on admission), DM 2, CVA with residual left hemiparesis, admitted on 9/1 after she presented from SNF with complaints of weakness, lethargy, dyspnea and reported transient dysarthria that resolved upon arrival to ED.  Prior to the admission, she was felt to be dehydrated at SNF and was given IV fluids.  Noted to be tachypneic, hypoxic in the 90s on room air, chest x-ray consistent with CHF, lab work showed mild hyperkalemia, creatinine of 1.49.  Initial CT head showed subacute versus chronic left basal ganglia infarct.  Unable to obtain MRI since patient has pacemaker.  Assessment & Plan:   Principal Problem:   Acute on chronic systolic CHF (congestive heart failure) (HCC) Active Problems:   Hypothyroidism   Insulin-requiring or dependent type II diabetes mellitus (HCC)   AKI (acute kidney injury) (HCC)   Acute encephalopathy   History of CVA with residual deficit   Elevated troponin   Hyperkalemia   Elevated lactic acid level   Transient speech disturbance   Acute encephalopathy/transient dysarthria and question of worsening left-sided weakness: Patient has history of CVA with chronic left hemiplegia.  CT head x2 were nonacute.  MRI brain could not be done due to pacemaker.  Continued on aspirin and statin for secondary stroke prevention.  Mental status changes felt to be likely due to medications and dehydration which seems to have resolved.  Neurontin, venlafaxine, Zanaflex and narcotics held.  Acute on chronic combined systolic and diastolic CHF: TTE 9/1: LVEF 20-25% and grade 1 diastolic dysfunction.  Suspected dehydration at SNF.  Appeared dehydrated here as well with associated worsening  creatinine.  Lasix was held and she was gently hydrated.  Appears compensated.  Continue carvedilol and Imdur.  No ACEI or ARB due to renal insufficiency.  May need to consider resuming low-dose diuretic.  Acute on stage III chronic kidney disease: Baseline creatinine reportedly 1.1.  Creatinine peaked to 1.5 on 9/3.  Lasix, ARB, Aldactone held.  Briefly hydrated with IV fluids.  Creatinine has improved to 1.28 and stable at same today.  DC IV fluids and follow BMP in a.m.  Hyperkalemia: Resolved.  History of CVA with residual left hemiplegia: Discussion as indicated above.  Type II DM/IDDM: A1c 7.3.  Reasonable inpatient control.  Holding metformin due to renal insufficiency.  Hypothyroid: TSH 12.56, free T4: 0.92.  As per previous notes, Synthroid 50 mcg listed on med rec but SNF unable to verify what dose patient was on prior to admit.  Continue current dose of Synthroid and will need to recheck TSH in 4 weeks.  Elevated troponin: Mild and flat trend.  No chest pain reported.  Could be due to renal insufficiency.  Continue aspirin, Imdur, beta-blocker and statins.  As per previous notes, poor candidate for invasive testing or intervention given her advanced age, poor baseline functional status from prior CVA and left-sided hemiplegia.  Outpatient cardiology follow-up.  Anemia of chronic disease: Stable.  Hypomagnesemia: Magnesium 1.2.  Received IV magnesium 4 g early this morning.  Follow magnesium level in a.m.  Paroxysmal SVT/AV paced rhythm: Reviewed telemetry with a cardiologist on 9/5 who indicated that patient had SVT.  Will increase carvedilol dose to 6.25 (on 12.5 PTA)  twice daily and monitor.   DVT prophylaxis: Subcutaneous heparin. Code Status: Full Family Communication: Reviewed records from OSH from 2012. Discussed in detail w daughter, updated care and answered questions. Disposition: DC to SNF pending clinical improvement   Consultants:  None  Procedures:   None  Antimicrobials:  None   Subjective: Patient noted to be sleeping on my arrival but was easily aroused, alert and oriented.  Denied complaints.  Denied dyspnea or chest pain.  Willing to eat.  As per nursing, progressively became more alert and interactive during the course of the day.  ROS: As above, otherwise negative.  Objective:  Vitals:   04/13/18 1019 04/13/18 1205 04/13/18 1228 04/13/18 1600  BP: 119/71 126/71 117/69 118/87  Pulse: (!) 59 (!) 59 60 60  Resp: 18 18 18 20   Temp:  98.4 F (36.9 C) 98.6 F (37 C) 98.2 F (36.8 C)  TempSrc:  Oral Oral Oral  SpO2: 100% 99% 98% 100%  Weight:      Height:        Examination:  General exam: Pleasant elderly female, moderately built and obese, lying comfortably propped up in bed. Respiratory system: Clear to auscultation. Respiratory effort normal.  Stable. Cardiovascular system: S1 & S2 heard, RRR. No JVD, murmurs, rubs, gallops or clicks.  Trace ankle edema.  Telemetry personally reviewed: BBB morphology, AV paced, paroxysmal SVT in the 120s-130s, confirmed with a cardiologist. Gastrointestinal system: Abdomen is nondistended, soft and nontender. No organomegaly or masses felt. Normal bowel sounds heard.  Stable. Central nervous system: Mental status as indicated above. No cranial nerve deficits noted.  No dysarthria or facial asymmetry.  Stable. Extremities: Grade 5 x 5 power in right limbs and grade 2 to 3 x 5 power left limbs.  No change. Skin: No rashes, lesions or ulcers Psychiatry: Judgement and insight appear impaired. Mood & affect appropriate.     Data Reviewed: I have personally reviewed following labs and imaging studies  CBC: Recent Labs  Lab 04/08/18 2115 04/09/18 0646 04/10/18 0432 04/11/18 0724  WBC 7.6 7.8 7.7 8.4  NEUTROABS 5.1  --   --   --   HGB 11.9* 10.5* 10.3* 10.5*  HCT 39.0 35.3* 33.9* 35.5*  MCV 93.8 94.1 93.4 93.9  PLT 269 266 258 244   Basic Metabolic Panel: Recent Labs  Lab  04/09/18 0646 04/10/18 0432 04/11/18 0724 04/12/18 0441 04/12/18 2021 04/13/18 0440  NA 138 140 136 137  --  137  K 5.6* 4.5 5.0 3.5  --  3.5  CL 104 102 99 99  --  100  CO2 19* 27 25 26   --  23  GLUCOSE 115* 129* 135* 154*  --  162*  BUN 34* 32* 36* 33*  --  34*  CREATININE 1.44* 1.48* 1.50* 1.28*  --  1.28*  CALCIUM 7.0* 7.2* 6.9* 6.9*  --  6.9*  MG  --   --  1.3*  --  1.2*  --    Liver Function Tests: Recent Labs  Lab 04/08/18 2115  AST 21  ALT 8  ALKPHOS 34*  BILITOT 0.6  PROT 7.4  ALBUMIN 3.4*   Coagulation Profile: Recent Labs  Lab 04/08/18 2115  INR 1.35   Cardiac Enzymes: Recent Labs  Lab 04/09/18 0017 04/09/18 0646 04/09/18 1219  TROPONINI 0.11* 0.12* 0.14*   HbA1C: No results for input(s): HGBA1C in the last 72 hours. CBG: Recent Labs  Lab 04/12/18 1946 04/12/18 2230 04/13/18 0754 04/13/18 1204 04/13/18 1603  GLUCAP 186* 157* 155* 156* 137*    Recent Results (from the past 240 hour(s))  Urine culture     Status: None   Collection Time: 04/08/18  8:29 PM  Result Value Ref Range Status   Specimen Description URINE, RANDOM  Final   Special Requests NONE  Final   Culture   Final    NO GROWTH Performed at [facility], [address], [place], [place] [address]    Report Status 04/10/2018 FINAL  Final  Culture, blood (Routine x 2)     Status: None   Collection Time: 04/08/18  9:15 PM  Result Value Ref Range Status   Specimen Description BLOOD BLOOD RIGHT FOREARM  Final   Special Requests   Final    BOTTLES DRAWN AEROBIC AND ANAEROBIC Blood Culture adequate volume   Culture   Final    NO GROWTH 5 DAYS Performed at [facility], [address] [place], [place] [address]    Report Status 04/13/2018 FINAL  Final  Culture, blood (Routine x 2)     Status: None   Collection Time: 04/08/18 10:20 PM  Result Value Ref Range Status   Specimen Description BLOOD RIGHT ANTECUBITAL  Final   Special Requests   Final    BOTTLES DRAWN  AEROBIC AND ANAEROBIC Blood Culture results may not be optimal due to an inadequate volume of blood received in culture bottles   Culture   Final    NO GROWTH 5 DAYS Performed at [facility], [address] [place], [place] [address]    Report Status 04/13/2018 FINAL  Final  MRSA PCR Screening     Status: None   Collection Time: 04/09/18  1:57 AM  Result Value Ref Range Status   MRSA by PCR NEGATIVE NEGATIVE Final    Comment:        The GeneXpert MRSA Assay (FDA approved for NASAL specimens only), is one component of a comprehensive MRSA colonization surveillance program. It is not intended to diagnose MRSA infection nor to guide or monitor treatment for MRSA infections. Performed at [facility], [address], [place], [place] [address]          Radiology Studies: No results found.      Scheduled Meds: . aspirin EC  81 mg Oral Daily  . atorvastatin  20 mg Oral q1800  . barrier cream  1 application Topical TID  . carvedilol  3.125 mg Oral BID WC  . cinacalcet  30 mg Oral BID WC  . heparin  5,000 Units Subcutaneous Q8H  . insulin aspart  0-5 Units Subcutaneous QHS  . insulin aspart  0-9 Units Subcutaneous TID WC  . isosorbide mononitrate  30 mg Oral Daily  . levothyroxine  50 mcg Oral QAC breakfast  . sodium chloride flush  3 mL Intravenous Q12H   Continuous Infusions:   LOS: 3 days     Marcellus Scott, MD, FACP, [facility]. [facility] Pager [phone]  If 7PM-7AM, please contact night-coverage [email or web address] Password [facility] 04/13/2018, 5:41 PM

## 2018-04-13 NOTE — Progress Notes (Signed)
Pt states she "feels miserable." Pt is dripping sweat from her forehead. Pt's vitals WNL and CBG= 137. RN notified MD. No new orders.

## 2018-04-13 NOTE — Progress Notes (Signed)
RN asked pt if she is ready to eat her breakfast. Pt nodded yes. RN sat pt up to eat, but pt is drowsy and sleeping. RN will attempt again once pt is fully awake. RN notified MD. No new orders.

## 2018-04-13 NOTE — Plan of Care (Signed)
?  Problem: Education: ?Goal: Knowledge of General Education information will improve ?Description: Including pain rating scale, medication(s)/side effects and non-pharmacologic comfort measures ?Outcome: Progressing ?  ?Problem: Health Behavior/Discharge Planning: ?Goal: Ability to manage health-related needs will improve ?Outcome: Progressing ?  ?Problem: Clinical Measurements: ?Goal: Ability to maintain clinical measurements within normal limits will improve ?Outcome: Progressing ?  ?Problem: Activity: ?Goal: Risk for activity intolerance will decrease ?Outcome: Progressing ?  ?Problem: Nutrition: ?Goal: Adequate nutrition will be maintained ?Outcome: Progressing ?  ?Problem: Elimination: ?Goal: Will not experience complications related to bowel motility ?Outcome: Progressing ?  ?Problem: Safety: ?Goal: Ability to remain free from injury will improve ?Outcome: Progressing ?  ?Problem: Skin Integrity: ?Goal: Risk for impaired skin integrity will decrease ?Outcome: Progressing ?  ?

## 2018-04-13 NOTE — Progress Notes (Signed)
PT Cancellation Note  Patient Details Name: Tina Waters MRN: [id number] DOB: [birth year]   Cancelled Treatment:    Reason Eval/Treat Not Completed: Fatigue/lethargy limiting ability to participate Unable to work with patient today, she arouses with sternal rub, but only briefly and is in and out of sleep. RN aware of lethargy.   Etta Grandchild, PT, DPT Acute Rehabilitation Services Pager: [phone] Office: [phone]     Etta Grandchild 04/13/2018, 10:51 AM

## 2018-04-14 ENCOUNTER — Encounter (HOSPITAL_COMMUNITY): Payer: Self-pay | Admitting: Cardiology

## 2018-04-14 DIAGNOSIS — I5023 Acute on chronic systolic (congestive) heart failure: Secondary | ICD-10-CM

## 2018-04-14 DIAGNOSIS — R748 Abnormal levels of other serum enzymes: Secondary | ICD-10-CM

## 2018-04-14 LAB — BASIC METABOLIC PANEL
ANION GAP: 15 (ref 5–15)
BUN: 32 mg/dL — AB (ref 8–23)
CO2: 23 mmol/L (ref 22–32)
Calcium: 7.1 mg/dL — ABNORMAL LOW (ref 8.9–10.3)
Chloride: 96 mmol/L — ABNORMAL LOW (ref 98–111)
Creatinine, Ser: 1.26 mg/dL — ABNORMAL HIGH (ref 0.44–1.00)
GFR calc Af Amer: 43 mL/min — ABNORMAL LOW (ref >60)
GFR, EST NON AFRICAN AMERICAN: 37 mL/min — AB (ref >60)
Glucose, Bld: 128 mg/dL — ABNORMAL HIGH (ref 70–99)
POTASSIUM: 4 mmol/L (ref 3.5–5.1)
SODIUM: 134 mmol/L — AB (ref 135–145)

## 2018-04-14 LAB — GLUCOSE, CAPILLARY
GLUCOSE-CAPILLARY: 135 mg/dL — AB (ref 70–99)
Glucose-Capillary: 131 mg/dL — ABNORMAL HIGH (ref 70–99)
Glucose-Capillary: 126 mg/dL — ABNORMAL HIGH (ref 70–99)
Glucose-Capillary: 129 mg/dL — ABNORMAL HIGH (ref 70–99)

## 2018-04-14 LAB — MAGNESIUM: MAGNESIUM: 1.7 mg/dL (ref 1.7–2.4)

## 2018-04-14 MED ORDER — FUROSEMIDE 40 MG PO TABS
40.0000 mg | ORAL_TABLET | Freq: Every day | ORAL | Status: DC
  Administered 2018-04-15: 40 mg via ORAL
  Filled 2018-04-14: qty 1

## 2018-04-14 MED ORDER — CARVEDILOL 12.5 MG PO TABS
12.5000 mg | ORAL_TABLET | Freq: Two times a day (BID) | ORAL | Status: DC
  Administered 2018-04-15: 12.5 mg via ORAL
  Filled 2018-04-14: qty 1

## 2018-04-14 NOTE — Progress Notes (Signed)
PROGRESS NOTE   Tina Waters  WUJ:[id number]    DOB: May 04, 1928    DOA: 04/08/2018  PCP: Eloisa Northern, MD   I have briefly reviewed patients previous medical records in [facility].  Brief Narrative:  84 year old female with PMH of hypothyroid, chronic systolic CHF (no prior EF on file on admission), DM 2, CVA with residual left hemiparesis, admitted on 9/1 after she presented from SNF with complaints of weakness, lethargy, dyspnea and reported transient dysarthria that resolved upon arrival to ED.  Prior to the admission, she was felt to be dehydrated at SNF and was given IV fluids.  Noted to be tachypneic, hypoxic in the 90s on room air, chest x-ray consistent with CHF, lab work showed mild hyperkalemia, creatinine of 1.49.  Initial CT head showed subacute versus chronic left basal ganglia infarct.  Unable to obtain MRI since patient has pacemaker.  Assessment & Plan:   Principal Problem:   Acute on chronic systolic CHF (congestive heart failure) (HCC) Active Problems:   Hypothyroidism   Insulin-requiring or dependent type II diabetes mellitus (HCC)   AKI (acute kidney injury) (HCC)   Acute encephalopathy   History of CVA with residual deficit   Elevated troponin   Hyperkalemia   Elevated lactic acid level   Transient speech disturbance   Acute encephalopathy/transient dysarthria and question of worsening left-sided weakness: Patient has history of CVA with chronic left hemiplegia.  CT head x2 were nonacute.  MRI brain could not be done due to pacemaker.  Continued on aspirin and statin for secondary stroke prevention.  Mental status changes felt to be likely due to medications and dehydration.  Neurontin, venlafaxine, Zanaflex and narcotics held.  Mental status changes resolved.  Acute on chronic combined systolic and diastolic CHF: TTE 9/1: LVEF 20-25% and grade 1 diastolic dysfunction.  Suspected dehydration at SNF.  Appeared dehydrated here as well with associated worsening  creatinine.  Lasix was held and she was gently hydrated.  Appears compensated.  Continue carvedilol and Imdur.  No ACEI or ARB due to renal insufficiency.  Cardiology was consulted, Lasix increased from 20 to 40 mg daily.  Monitor BMP closely.  Suspected paroxysmal atrial tachycardia: Ongoing despite increasing carvedilol dose.  Hence cardiology consulted 9/6 and input appreciated.  Nitrates discontinued and carvedilol increased further to 12.5 mg twice daily.  Status post pacemaker: Interrogated and reportedly normal.  Also informed that this is not compatible for MRI.  Acute on stage III chronic kidney disease: Baseline creatinine reportedly 1.1.  Creatinine peaked to 1.5 on 9/3.  Lasix, ARB, Aldactone held.  Briefly hydrated with IV fluids.  Creatinine has improved and stable in the 1.28-1.26 range for the last 3 days.  Follow BMP with increased diuretics.  Hyperkalemia: Resolved.  History of CVA with residual left hemiplegia: Discussion as indicated above.  Type II DM/IDDM: A1c 7.3.  Reasonable inpatient control.  Holding metformin due to renal insufficiency.  Hypothyroid: TSH 12.56, free T4: 0.92.  As per previous notes, Synthroid 50 mcg listed on med rec but SNF unable to verify what dose patient was on prior to admit.  Continue current dose of Synthroid and will need to recheck TSH in 4 weeks.  Elevated troponin: Mild and flat trend.  No chest pain reported.  Could be due to renal insufficiency.  Continue aspirin, Imdur, beta-blocker and statins.  As per previous notes, poor candidate for invasive testing or intervention given her advanced age, poor baseline functional status from prior CVA and left-sided hemiplegia.  Outpatient cardiology follow-up.  Anemia of chronic disease: Stable.  Hypomagnesemia: Magnesium 1.2.  Received IV magnesium 4 g early this morning.  Check magnesium level today and replace as needed.    DVT prophylaxis: Subcutaneous heparin. Code Status: Full Family  Communication: None at bedside today. Disposition: DC to SNF pending clinical improvement   Consultants:  None  Procedures:  None  Antimicrobials:  None   Subjective: Patient awake, alert and oriented to person, place and partly to time.  She denied complaints.  Although appeared mildly dyspneic at times, denied dyspnea or chest pain.  Able to tell me that she had 7 children, 2 sons and 4 daughters now and one child is demised.  Original resident of [place].  ROS: As above, otherwise negative.  Objective:  Vitals:   04/13/18 1955 04/14/18 0350 04/14/18 0441 04/14/18 1219  BP: 109/64 111/78  113/70  Pulse: (!) 56 (!) 39 (!) 124 71  Resp: 18 18  15   Temp: 98.3 F (36.8 C) 98.5 F (36.9 C)  98.3 F (36.8 C)  TempSrc: Oral Oral  Oral  SpO2: 98% 98% 95% 96%  Weight:  83.9 kg    Height:        Examination:  General exam: Pleasant elderly female, moderately built and obese, lying comfortably propped up in bed.  At times appears slightly dyspneic with talking. Respiratory system: Occasional basal crackles.  Rest of lung fields clear to auscultation. Respiratory effort normal.   Cardiovascular system: S1 & S2 heard, RRR. No JVD, murmurs, rubs, gallops or clicks.  Trace ankle edema.  Telemetry personally reviewed: Sinus rhythm, AV paced rhythm, frequent episodes of atrial tachycardia up to 150s. Gastrointestinal system: Abdomen is nondistended, soft and nontender. No organomegaly or masses felt. Normal bowel sounds heard.  Stable. Central nervous system: Mental status as indicated above. No cranial nerve deficits noted.  No dysarthria or facial asymmetry.  Stable. Extremities: Grade 5 x 5 power in right limbs and grade 2 to 3 x 5 power left limbs.  No change. Skin: No rashes, lesions or ulcers Psychiatry: Judgement and insight appear impaired. Mood & affect appropriate.     Data Reviewed: I have personally reviewed following labs and imaging studies  CBC: Recent Labs  Lab  04/08/18 2115 04/09/18 0646 04/10/18 0432 04/11/18 0724  WBC 7.6 7.8 7.7 8.4  NEUTROABS 5.1  --   --   --   HGB 11.9* 10.5* 10.3* 10.5*  HCT 39.0 35.3* 33.9* 35.5*  MCV 93.8 94.1 93.4 93.9  PLT 269 266 258 244   Basic Metabolic Panel: Recent Labs  Lab 04/10/18 0432 04/11/18 0724 04/12/18 0441 04/12/18 2021 04/13/18 0440 04/14/18 0548  NA 140 136 137  --  137 134*  K 4.5 5.0 3.5  --  3.5 4.0  CL 102 99 99  --  100 96*  CO2 27 25 26   --  23 23  GLUCOSE 129* 135* 154*  --  162* 128*  BUN 32* 36* 33*  --  34* 32*  CREATININE 1.48* 1.50* 1.28*  --  1.28* 1.26*  CALCIUM 7.2* 6.9* 6.9*  --  6.9* 7.1*  MG  --  1.3*  --  1.2*  --   --    Liver Function Tests: Recent Labs  Lab 04/08/18 2115  AST 21  ALT 8  ALKPHOS 34*  BILITOT 0.6  PROT 7.4  ALBUMIN 3.4*   Coagulation Profile: Recent Labs  Lab 04/08/18 2115  INR 1.35   Cardiac Enzymes:  Recent Labs  Lab 04/09/18 0017 04/09/18 0646 04/09/18 1219  TROPONINI 0.11* 0.12* 0.14*   HbA1C: No results for input(s): HGBA1C in the last 72 hours. CBG: Recent Labs  Lab 04/13/18 1603 04/13/18 2131 04/14/18 0832 04/14/18 1328 04/14/18 1620  GLUCAP 137* 124* 135* 131* 126*    Recent Results (from the past 240 hour(s))  Urine culture     Status: None   Collection Time: 04/08/18  8:29 PM  Result Value Ref Range Status   Specimen Description URINE, RANDOM  Final   Special Requests NONE  Final   Culture   Final    NO GROWTH Performed at [facility], [address], [place], [place] [address]    Report Status 04/10/2018 FINAL  Final  Culture, blood (Routine x 2)     Status: None   Collection Time: 04/08/18  9:15 PM  Result Value Ref Range Status   Specimen Description BLOOD BLOOD RIGHT FOREARM  Final   Special Requests   Final    BOTTLES DRAWN AEROBIC AND ANAEROBIC Blood Culture adequate volume   Culture   Final    NO GROWTH 5 DAYS Performed at [facility], [address], [place], [place]  [address]    Report Status 04/13/2018 FINAL  Final  Culture, blood (Routine x 2)     Status: None   Collection Time: 04/08/18 10:20 PM  Result Value Ref Range Status   Specimen Description BLOOD RIGHT ANTECUBITAL  Final   Special Requests   Final    BOTTLES DRAWN AEROBIC AND ANAEROBIC Blood Culture results may not be optimal due to an inadequate volume of blood received in culture bottles   Culture   Final    NO GROWTH 5 DAYS Performed at [facility], [address] [place], [place] [address]    Report Status 04/13/2018 FINAL  Final  MRSA PCR Screening     Status: None   Collection Time: 04/09/18  1:57 AM  Result Value Ref Range Status   MRSA by PCR NEGATIVE NEGATIVE Final    Comment:        The GeneXpert MRSA Assay (FDA approved for NASAL specimens only), is one component of a comprehensive MRSA colonization surveillance program. It is not intended to diagnose MRSA infection nor to guide or monitor treatment for MRSA infections. Performed at [facility], [address] [place], [place] [address]          Radiology Studies: No results found.      Scheduled Meds: . aspirin EC  81 mg Oral Daily  . atorvastatin  20 mg Oral q1800  . barrier cream  1 application Topical TID  . [START ON 04/15/2018] carvedilol  12.5 mg Oral BID WC  . cholecalciferol  1,000 Units Oral Daily  . cinacalcet  30 mg Oral BID WC  . [START ON 04/15/2018] furosemide  40 mg Oral Daily  . heparin  5,000 Units Subcutaneous Q8H  . insulin aspart  0-5 Units Subcutaneous QHS  . insulin aspart  0-9 Units Subcutaneous TID WC  . levothyroxine  50 mcg Oral QAC breakfast  . pantoprazole  40 mg Oral Daily  . sodium chloride flush  3 mL Intravenous Q12H   Continuous Infusions:   LOS: 4 days     Marcellus Scott, MD, FACP, [facility]. [facility] Pager [phone]  If 7PM-7AM, please contact night-coverage [email or web address] Password [email or web address] 04/14/2018, 5:35 PM

## 2018-04-14 NOTE — Progress Notes (Signed)
Physical Therapy Treatment Patient Details Name: Tina Waters MRN: [id number] DOB: 01-Mar-1928 Today's Date: 04/14/2018    History of Present Illness Pt is an 84 y/o female presenting from SNF secondary to AMS and worsening L sided weakness. Thought to have acute encephalopathy. Chest imaging findings consistent with CHF and CT of head revealed acute to subacute infarct in L basal ganglia. PMH includes CHF, HTN, CVA with L sided deficits, pacemaker, and dementia.     PT Comments    Patient more alert today, willing to participate in therapy. Focused on bed mobility and bed level therex.  Pt cont with L sided lean sitting, can maintain balance for short periods of time before requiring physical assistance to correct. Pt max A for bed mobility at this time.  HRmax 120 this session.    Follow Up Recommendations  SNF;Supervision/Assistance - 24 hour     Equipment Recommendations  None recommended by PT    Recommendations for Other Services       Precautions / Restrictions Precautions Precautions: Fall Restrictions Weight Bearing Restrictions: No    Mobility  Bed Mobility Overal bed mobility: Needs Assistance Bed Mobility: Rolling Rolling: Max assist   Supine to sit: Max assist Sit to supine: Max assist   General bed mobility comments: Max A to bring legs over EOB and raise trunk. patient able to maintain sitting balance with BUE support for 20 seconds a  time then needs min A to correct. Lateral Lean. worked on scooting in bed laterally and maintaining balance.   Transfers                 General transfer comment: Unsafe to attempt.   Ambulation/Gait                 Stairs             Wheelchair Mobility    Modified Rankin (Stroke Patients Only)       Balance Overall balance assessment: Needs assistance Sitting-balance support: Single extremity supported;Feet supported Sitting balance-Leahy Scale: Poor Sitting balance - Comments: Reliant on  Max A to maintain sitting balance secondary to L lateral lean.  Postural control: Left lateral lean                                  Cognition Arousal/Alertness: Awake/alert Behavior During Therapy: Flat affect Overall Cognitive Status: No family/caregiver present to determine baseline cognitive functioning                                 General Comments: Pt does have dementia listed in her history, slow processing. AOx4 but at times does not answer questions      Exercises      General Comments        Pertinent Vitals/Pain Pain Assessment: No/denies pain    Home Living                      Prior Function            PT Goals (current goals can now be found in the care plan section) Acute Rehab PT Goals Patient Stated Goal: to go back to SNF at d/c  PT Goal Formulation: With patient Time For Goal Achievement: 04/24/18 Potential to Achieve Goals: Fair Progress towards PT goals: Progressing toward goals    Frequency  Min 2X/week      PT Plan Current plan remains appropriate    Co-evaluation              AM-PAC PT "6 Clicks" Daily Activity  Outcome Measure  Difficulty turning over in bed (including adjusting bedclothes, sheets and blankets)?: Unable Difficulty moving from lying on back to sitting on the side of the bed? : Unable Difficulty sitting down on and standing up from a chair with arms (e.g., wheelchair, bedside commode, etc,.)?: Unable Help needed moving to and from a bed to chair (including a wheelchair)?: Total Help needed walking in hospital room?: Total Help needed climbing 3-5 steps with a railing? : Total 6 Click Score: 6    End of Session Equipment Utilized During Treatment: Gait belt Activity Tolerance: Patient tolerated treatment well Patient left: in bed;with call bell/phone within reach;with bed alarm set Nurse Communication: Mobility status PT Visit Diagnosis: Unsteadiness on feet  (R26.81);Hemiplegia and hemiparesis;Difficulty in walking, not elsewhere classified (R26.2);Muscle weakness (generalized) (M62.81) Hemiplegia - Right/Left: Left Hemiplegia - dominant/non-dominant: Non-dominant Hemiplegia - caused by: Cerebral infarction     Time: 1152-1208 PT Time Calculation (min) (ACUTE ONLY): 16 min  Charges:  $Therapeutic Activity: 8-22 mins                     Etta Grandchild, PT, DPT Acute Rehabilitation Services Pager: [phone] Office: [phone]    Etta Grandchild 04/14/2018, 1:50 PM

## 2018-04-14 NOTE — Consult Note (Addendum)
Cardiology Consultation:   Patient ID: Tina Waters; [id number]; 07/04/1928   Admit date: 04/08/2018 Date of Consult: 04/14/2018  Primary Care Provider: Eloisa Northern, MD Primary Cardiologist: New to [facility]   Patient Profile:   Tina Waters is a 84 y.o. female with a hx of chronic systolic CHF, type 2 diabetes mellitus, hypothyroidism, PPM placement (St. Jude 2012 [facility]) and history of CVA with residual deficits  who is being seen today for the evaluation of acute on chronic CHF exacerbation with elevated troponin at the request of Dr. Waymon Amato.  History of Present Illness:   Tina Waters is an 84 year old female who presented to [facility] from SNF on 04/08/2018 secondary to lethargy, increased work of breathing and a transient episode of slurred speech occurring on 04/06/2018. History difficult to obtain secondary to above. No family at bedside. Per chart review, in  the ED the patient was found to be afebrile with saturations in the low 90s on room air, tachypneic but otherwise stable vital signs. EKG performed revealed NSR with conduction defect, non specific T wave abnormalities BBB  and LVH.  CXR upon presentation was notable for cardiomegaly, bilateral pleural effusions and vascular congestion suggestive of CHF as well as bibasilar atelectasis versus questionable consolidation. BNP found to be markedly elevated at greater than 4500 after arrival. Potassium was found to be 5.9. Creatinine was elevated at 1.49 up from 1.0 in 01/2018. Given her presenting symptoms, head CT performed which showed hypodensity in the left basal ganglia felt to reflect subacute to chronic infarct. Patient was started on Levaquin for questionable pneumonia.  Neurology recommended MRI of the brain however unable to obtain secondary to PPM.  In interview today, she denies chest pain and states that her breathing is improved. At baseline, she states that she is wheelchair bound and recieves somewhat total care. She is able  to tell me that she lives in a SNF, but cannot tell me where. Again, history somewhat complicated and difficult as she cannot tell me who her primary cardiologist is or other pertinent information.   Cardiology has been asked to consult given acute CHF exacerbation, elevated troponin and new tachycardia.  Past Medical History:  Diagnosis Date  . CHF (congestive heart failure) (HCC)   . Hypertension   . Stroke (HCC)   . Thyroid disease     History reviewed. No pertinent surgical history.   Prior to Admission medications   Medication Sig Start Date End Date Taking? Authorizing Provider  acetaminophen (TYLENOL) 500 MG tablet Take 500 mg by mouth every 8 (eight) hours as needed for moderate pain.   Yes [provider]  aspirin EC 81 MG tablet Take 81 mg by mouth daily.   Yes [provider]  atorvastatin (LIPITOR) 20 MG tablet Take 20 mg by mouth at bedtime.   Yes [provider]  carvedilol (COREG) 12.5 MG tablet Take 12.5 mg by mouth 2 (two) times daily.   Yes [provider]  cholecalciferol (VITAMIN D) 1000 units tablet Take 1,000 Units by mouth daily.   Yes [provider]  cinacalcet (SENSIPAR) 30 MG tablet Take 30 mg by mouth 2 (two) times daily.   Yes [provider]  cyclobenzaprine (FLEXERIL) 5 MG tablet Take 5 mg by mouth every 6 (six) hours as needed (shoulder pain).   Yes [provider]  furosemide (LASIX) 40 MG tablet Take 20 mg by mouth daily.    Yes [provider]  gabapentin (NEURONTIN) 300 MG  capsule Take 300 mg by mouth 2 (two) times daily.   Yes [provider]  gabapentin (NEURONTIN) 400 MG capsule Take 400 mg by mouth at bedtime.   Yes [provider]  Glycerin-Hypromellose-PEG 400 (ARTIFICIAL TEARS) 0.2-0.2-1 % SOLN Place 1 drop into both eyes 2 (two) times daily as needed (dry eyes).   Yes [provider]  Infant Care Products (DERMACLOUD) CREA Apply 1 application  topically 3 (three) times daily. Apply to sacrum and buttocks topically every shift to protect skin from breakdown   Yes [provider]  insulin aspart (NOVOLOG) 100 UNIT/ML injection Inject 2-10 Units into the skin 4 (four) times daily - after meals and at bedtime. Per sliding scale if BS 151-200 = 2 units, 201-250 = 4 units, 251-300 = 6 units, 301-350 = 8 units, 351-400 = 10 units   Yes [provider]  Insulin Detemir (LEVEMIR FLEXTOUCH) 100 UNIT/ML Pen Inject 10 Units into the skin at bedtime.   Yes [provider]  isosorbide mononitrate (IMDUR) 30 MG 24 hr tablet Take 30 mg by mouth daily.   Yes [provider]  levothyroxine (SYNTHROID, LEVOTHROID) 25 MCG tablet Take 50 mcg by mouth daily.    Yes [provider]  loperamide (IMODIUM A-D) 2 MG tablet Take 2 mg by mouth 4 (four) times daily as needed for diarrhea or loose stools.   Yes [provider]  losartan (COZAAR) 100 MG tablet Take 100 mg by mouth daily.   Yes [provider]  metFORMIN (GLUCOPHAGE) 500 MG tablet Take 500 mg by mouth 2 (two) times daily.   Yes [provider]  nitroGLYCERIN (NITROSTAT) 0.4 MG SL tablet Place 0.4 mg under the tongue every 5 (five) minutes as needed for chest pain. For up to 3 doses. If no relief, call MD   Yes [provider]  pantoprazole (PROTONIX) 40 MG tablet Take 40 mg by mouth daily.   Yes [provider]  spironolactone (ALDACTONE) 25 MG tablet Take 12.5 mg by mouth 2 (two) times daily.   Yes [provider]  tiZANidine (ZANAFLEX) 2 MG tablet Take 2 mg by mouth every 8 (eight) hours as needed (shoulder pain).   Yes [provider]  venlafaxine XR (EFFEXOR-XR) 75 MG 24 hr capsule Take 75 mg by mouth daily with breakfast.   Yes [provider]    Inpatient Medications: Scheduled Meds: . aspirin EC  81 mg Oral Daily  . atorvastatin  20 mg Oral q1800  . barrier cream  1 application  Topical TID  . carvedilol  6.25 mg Oral BID WC  . cholecalciferol  1,000 Units Oral Daily  . cinacalcet  30 mg Oral BID WC  . furosemide  20 mg Oral Daily  . heparin  5,000 Units Subcutaneous Q8H  . insulin aspart  0-5 Units Subcutaneous QHS  . insulin aspart  0-9 Units Subcutaneous TID WC  . isosorbide mononitrate  30 mg Oral Daily  . levothyroxine  50 mcg Oral QAC breakfast  . pantoprazole  40 mg Oral Daily  . sodium chloride flush  3 mL Intravenous Q12H   Continuous Infusions:  PRN Meds: acetaminophen **OR** [DISCONTINUED] acetaminophen, [DISCONTINUED] ondansetron **OR** ondansetron (ZOFRAN) IV, senna-docusate  Allergies:    Allergies  Allergen Reactions  . Penicillins     Has patient had a PCN reaction causing immediate rash, facial/tongue/throat swelling, SOB or lightheadedness with hypotension: Yes Has patient had a PCN reaction causing severe rash involving mucus membranes  or skin necrosis: No Has patient had a PCN reaction that required hospitalization:No Has patient had a PCN reaction occurring within the last 10 years: No If all of the above answers are "NO", then may proceed with Cephalosporin use.    Social History:   Social History   Socioeconomic History  . Marital status: Widowed    Spouse name: Not on file  . Number of children: Not on file  . Years of education: Not on file  . Highest education level: Not on file  Occupational History  . Not on file  Social Needs  . Financial resource strain: Not on file  . Food insecurity:    Worry: Not on file    Inability: Not on file  . Transportation needs:    Medical: Not on file    Non-medical: Not on file  Tobacco Use  . Smoking status: Never Smoker  . Smokeless tobacco: Never Used  Substance and Sexual Activity  . Alcohol use: No    Frequency: Never  . Drug use: No  . Sexual activity: Not on file  Lifestyle  . Physical activity:    Days per week: Not on file    Minutes per session: Not on file  .  Stress: Not on file  Relationships  . Social connections:    Talks on phone: Not on file    Gets together: Not on file    Attends religious service: Not on file    Active member of club or organization: Not on file    Attends meetings of clubs or organizations: Not on file    Relationship status: Not on file  . Intimate partner violence:    Fear of current or ex partner: Not on file    Emotionally abused: Not on file    Physically abused: Not on file    Forced sexual activity: Not on file  Other Topics Concern  . Not on file  Social History Narrative  . Not on file    Family History:   Family History  Problem Relation Age of Onset  . Obesity Daughter    Family Status:  Family Status  Relation Name Status  . Daughter  (Not Specified)    ROS:  Please see the history of present illness.  All other ROS reviewed and negative.     Physical Exam/Data:   Vitals:   04/13/18 1955 04/14/18 0350 04/14/18 0441 04/14/18 1219  BP: 109/64 111/78  113/70  Pulse: (!) 56 (!) 39 (!) 124 71  Resp: 18 18  15   Temp: 98.3 F (36.8 C) 98.5 F (36.9 C)  98.3 F (36.8 C)  TempSrc: Oral Oral  Oral  SpO2: 98% 98% 95% 96%  Weight:  83.9 kg    Height:        Intake/Output Summary (Last 24 hours) at 04/14/2018 1548 Last data filed at 04/14/2018 0951 Gross per 24 hour  Intake 480 ml  Output 700 ml  Net -220 ml   Filed Weights   04/12/18 0242 04/13/18 0333 04/14/18 0350  Weight: 83.1 kg 85 kg 83.9 kg   Body mass index is 33.83 kg/m.   General: Obese,  NAD Skin: Warm, dry, intact  Head: Normocephalic, atraumatic, clear, moist mucus membranes. Neck: Negative for carotid bruits. No JVD Lungs:Clear to ausculation bilaterally. No wheezes, rales, or rhonchi. Breathing is unlabored. Cardiovascular: RRR with S1 S2. No murmurs, rubs, gallops, or LV heave appreciated. Abdomen: Soft, non-tender, non-distended with normoactive bowel sounds.  MSK: Strength  and tone appear normal for age. 5/5 in  all extremities Extremities: 2+ BLE edema. No clubbing or cyanosis. DP/PT pulses 2+ bilaterally Neuro: Alert and oriented to self. No focal deficits. No facial asymmetry. MAE spontaneously. Psych: Responds to questions somewhat appropriately with normal affect.     EKG:  The EKG was personally reviewed and demonstrates: 04/12/18 NSR with conduction defect, non specific T wave abnormalities BBB  and LVH Telemetry:  Telemetry was personally reviewed and demonstrates:  04/14/18 Intermittent atrial tachycardia with pacing  Relevant CV Studies:  ECHO: 04/09/2018: Study Conclusions  - Left ventricle: The cavity size was mildly dilated. Wall   thickness was increased in a pattern of mild LVH. Systolic   function was severely reduced. The estimated ejection fraction   was in the range of 20% to 25%. Severe diffuse hypokinesis with   no identifiable regional variations. Doppler parameters are   consistent with abnormal left ventricular relaxation (grade 1   diastolic dysfunction). - Ventricular septum: The contour showed diastolic flattening. - Aortic valve: There was trivial regurgitation. - Left atrium: The atrium was moderately to severely dilated. - Right ventricle: The cavity size was mildly dilated. Systolic   function was moderately reduced. - Right atrium: The atrium was severely dilated. - Atrial septum: The septum bowed from right to left, consistent   with increased right atrial pressure. No defect or patent foramen   ovale was identified. - Tricuspid valve: There was severe regurgitation directed   centrally. - Pulmonary arteries: Systolic pressure was moderately increased.   PA peak pressure: 52 mm Hg (S).  CATH: None  Laboratory Data:  Chemistry Recent Labs  Lab 04/12/18 0441 04/13/18 0440 04/14/18 0548  NA 137 137 134*  K 3.5 3.5 4.0  CL 99 100 96*  CO2 26 23 23   GLUCOSE 154* 162* 128*  BUN 33* 34* 32*  CREATININE 1.28* 1.28* 1.26*  CALCIUM 6.9* 6.9* 7.1*    GFRNONAA 36* 36* 37*  GFRAA 42* 42* 43*  ANIONGAP 12 14 15     Total Protein  Date Value Ref Range Status  04/08/2018 7.4 6.5 - 8.1 g/dL Final   Albumin  Date Value Ref Range Status  04/08/2018 3.4 (L) 3.5 - 5.0 g/dL Final   AST  Date Value Ref Range Status  04/08/2018 21 15 - 41 U/L Final   ALT  Date Value Ref Range Status  04/08/2018 8 0 - 44 U/L Final   Alkaline Phosphatase  Date Value Ref Range Status  04/08/2018 34 (L) 38 - 126 U/L Final   Total Bilirubin  Date Value Ref Range Status  04/08/2018 0.6 0.3 - 1.2 mg/dL Final   Hematology Recent Labs  Lab 04/09/18 0646 04/10/18 0432 04/11/18 0724  WBC 7.8 7.7 8.4  RBC 3.75* 3.63* 3.78*  HGB 10.5* 10.3* 10.5*  HCT 35.3* 33.9* 35.5*  MCV 94.1 93.4 93.9  MCH 28.0 28.4 27.8  MCHC 29.7* 30.4 29.6*  RDW 16.1* 16.1* 16.1*  PLT 266 258 244   Cardiac Enzymes Recent Labs  Lab 04/09/18 0017 04/09/18 0646 04/09/18 1219  TROPONINI 0.11* 0.12* 0.14*    Recent Labs  Lab 04/08/18 2243  TROPIPOC 0.12*    BNP Recent Labs  Lab 04/09/18 0017 04/11/18 1442  BNP >4,500.0* >4,500.0*    DDimer No results for input(s): DDIMER in the last 168 hours. TSH:  Lab Results  Component Value Date   TSH 12.563 (H) 04/09/2018   Lipids: Lab Results  Component Value Date   CHOL  92 04/09/2018   HDL 32 (L) 04/09/2018   LDLCALC 42 04/09/2018   TRIG 89 04/09/2018   CHOLHDL 2.9 04/09/2018   HgbA1c: Lab Results  Component Value Date   HGBA1C 7.3 (H) 04/09/2018    Radiology/Studies:  Dg Chest Port 1 View  Result Date: 04/11/2018 CLINICAL DATA:  Short of breath EXAM: PORTABLE CHEST 1 VIEW COMPARISON:  04/08/2018 FINDINGS: Left-sided pacing device as before. Cardiomegaly with vascular congestion and mild pulmonary edema. Bilateral pleural effusions. Bibasilar consolidations. Moderate to large hiatal hernia. Aortic atherosclerosis. No pneumothorax. Right shoulder deformity with discontinuity at the [facility] joint, humeral head  deformity, and subluxed appearance of the right humeral head. IMPRESSION: 1. Cardiomegaly with vascular congestion and pulmonary edema, not significantly changed. 2. Moderate pleural effusions and bibasilar consolidations, unchanged 3. Large hiatal hernia Electronically Signed   By: Jasmine Pang M.D.   On: 04/11/2018 14:13   Assessment and Plan:   1.  Acute on chronic systolic CHF with elevated troponin: -Presented with acute symptoms of dyspnea, lethargy and stroke like symptoms beginning 04/06/2018 -CXR on presentation with cardiomegaly, bilateral effusions and vascular congestion consistent with atelectasis versus consolidation -Patient initially treated with Levaquin in the ED for questionable pneumonia however high suspicion for CHF upon presentation -EKG with evidence of LVH and conduction abnormality -BNP from 04/09/2018 >4500 -Troponin found to be mildly elevated at 0.11>0.12>0.14 likely in the setting of acute fluid volume overload and demand ischemia -Echocardiogram performed 04/09/2018 with LVEF of 20 to 25% with G1 DD -Denies chest pain or other ACS symptoms  -Continue carvedilol, no ACE or ARB in the setting of acute renal insufficiency -Now on 20 mg Lasix PO daily -Will have PPM interrogated given her new onset tachycardia  -Once interrogated, can increase BB for rate control depending on BP stability    2.  Elevated troponin: -Denies chest pain or other ACS symptoms -Troponin, 0.11> 0.12>0.14 likely in the setting of acute fluid volume overload and demand ischemia -Echocardiogram performed 04/09/2017 with LVEF of 20 to 25% with G1 DD no comparable study -Continue beta-blocker, statin, ASA and Imdur -Given advanced age and poor functional baseline capacity, likely not a candidate for invasive ischemic evaluation  3.  Acute encephalopathy: -Patient presented from SNF with dyspnea, increased work of breathing, lethargy and acute stroke symptoms with a history of chronic left-sided  weakness secondary to CVA -CT head x2 were non-acute -RI of brain not completed secondary to PPM -Continue ASA, statin for secondary stroke prevention -Likely AMS secondary to medications and dehydration which are now resolving  4.  Acute on chronic kidney injury stage II: -Creatinine on admission, 1.49 up from baseline of 1.3 at SNF and 1.00 and 01/2018 -Creatinine, 1.26 today and stable -AKI suspected secondary to acute CHF -Patient started on IV Lasix in the ED -Lasix, ARB and Aldactone on hold -Continue monitoring renal function closely  5.  DM2: -Hemoglobin A1c, uncontrolled at 7.3 -Managed at SNF with metformin and insulin>> metformin currently on hold -SSI for glucose control while inpatient  6.  Hypothyroidism: -TSH, 12.56 with free T4 at 0.92 -Continue Synthroid 50 mcg -Recheck TSH in 4 to 6 weeks per primary team  7. Hx of PPM, St. Jude 2012: -Pt with St. Jude PPM in place. It appears that this was placed at [facility] in 2012.  -We will have this interrogated for functionality -If functional and remains tachy, can increase BB for greater rate control -She appears to be asymptomatic    For  questions or updates, please contact [facility] Please consult [email or web address] for contact info under Cardiology/STEMI.   Signed, Georgie Chard NP-C [facility] Pager: [phone] 04/14/2018 3:48 PM  As above, pt seen and examined; briefly she is an 84 year old female with a past medical history of chronic systolic congestive heart failure, diabetes mellitus, prior CVA with residual right hemiparesis, prior pacemaker placement, hypothyroidism admitted with possible new CVA for evaluation of tachycardia and acute on chronic systolic congestive heart failure.  Patient is bedbound.  She is a difficult historian.  She apparently was admitted with an episode of slurred speech and increased dyspnea.  Patient states that she still has some dyspnea but improved.  She describes  occasional chest pain as well though again history is difficult.  Chest x-ray shows congestion and bilateral pleural effusions.  Creatinine is 1.26.  BNP is greater than 4500.  Troponin I0.11, 0.12 and 0.14.  Echocardiogram shows severe LV dysfunction, biatrial enlargement, severe tricuspid regurgitation.  Electrocardiogram shows sinus rhythm with PVCs, ventricular pacing, paroxysmal atrial tachycardia, right bundle branch block and left anterior fascicular block.  Review of telemetry personally showed sinus rhythm with PVCs, ventricular pacing and intermittent paroxysmal atrial tachycardia with elevated heart rate.  1 acute on chronic systolic congestive heart failure-I will increase Lasix to 40 mg daily.  Follow renal function.  Needs low-sodium diet and fluid restriction.  2 probable paroxysmal atrial tachycardia-blood pressure is borderline.  DC isosorbide. I will try and increase carvedilol to 12.5 mg twice daily to see if she tolerates.  If not and rhythm disturbances persist could treat with amiodarone.  3 prior pacemaker-needs interrogation.  4 mildly elevated troponin-no clear trend and not consistent with acute coronary syndrome.  Patient also not a candidate for aggressive cardiac evaluation.  5 CVA-continue aspirin and Plavix.  Per primary care.  6 cardiomyopathy-continue beta-blocker.  I will not add an ACE inhibitor, ARB, or hydralazine as blood pressure is borderline and she also has renal insufficiency.  7 acute on chronic stage III kidney disease-follow renal function with Lasix.  Olga Millers, MD

## 2018-04-14 NOTE — Progress Notes (Signed)
   04/14/18 0727  MEWS Assessment  Is this an acute change? No   Pt has a wide range of consistently changing HR from bradycardic 40s to Tachycardic 130s. MD already aware.

## 2018-04-15 DIAGNOSIS — E039 Hypothyroidism, unspecified: Secondary | ICD-10-CM

## 2018-04-15 DIAGNOSIS — I6930 Unspecified sequelae of cerebral infarction: Secondary | ICD-10-CM

## 2018-04-15 DIAGNOSIS — E119 Type 2 diabetes mellitus without complications: Secondary | ICD-10-CM

## 2018-04-15 DIAGNOSIS — Z794 Long term (current) use of insulin: Secondary | ICD-10-CM

## 2018-04-15 DIAGNOSIS — E875 Hyperkalemia: Secondary | ICD-10-CM

## 2018-04-15 LAB — GLUCOSE, CAPILLARY
GLUCOSE-CAPILLARY: 112 mg/dL — AB (ref 70–99)
GLUCOSE-CAPILLARY: 149 mg/dL — AB (ref 70–99)

## 2018-04-15 LAB — BASIC METABOLIC PANEL
ANION GAP: 14 (ref 5–15)
BUN: 31 mg/dL — ABNORMAL HIGH (ref 8–23)
CALCIUM: 7.2 mg/dL — AB (ref 8.9–10.3)
CO2: 26 mmol/L (ref 22–32)
Chloride: 97 mmol/L — ABNORMAL LOW (ref 98–111)
Creatinine, Ser: 1.24 mg/dL — ABNORMAL HIGH (ref 0.44–1.00)
GFR, EST AFRICAN AMERICAN: 44 mL/min — AB (ref >60)
GFR, EST NON AFRICAN AMERICAN: 38 mL/min — AB (ref >60)
GLUCOSE: 120 mg/dL — AB (ref 70–99)
POTASSIUM: 3.9 mmol/L (ref 3.5–5.1)
SODIUM: 137 mmol/L (ref 135–145)

## 2018-04-15 MED ORDER — FUROSEMIDE 40 MG PO TABS: 40.0000 mg | ORAL_TABLET | Freq: Every day | ORAL | Status: DC

## 2018-04-15 NOTE — Progress Notes (Signed)
Progress Note  Patient Name: Tina Waters Date of Encounter: 04/15/2018  Primary Cardiologist: Jens Som  Subjective   No chest pain or dyspnea  Inpatient Medications    Scheduled Meds: . aspirin EC  81 mg Oral Daily  . atorvastatin  20 mg Oral q1800  . barrier cream  1 application Topical TID  . carvedilol  12.5 mg Oral BID WC  . cholecalciferol  1,000 Units Oral Daily  . cinacalcet  30 mg Oral BID WC  . furosemide  40 mg Oral Daily  . heparin  5,000 Units Subcutaneous Q8H  . insulin aspart  0-5 Units Subcutaneous QHS  . insulin aspart  0-9 Units Subcutaneous TID WC  . levothyroxine  50 mcg Oral QAC breakfast  . pantoprazole  40 mg Oral Daily  . sodium chloride flush  3 mL Intravenous Q12H   Continuous Infusions:  PRN Meds: acetaminophen **OR** [DISCONTINUED] acetaminophen, [DISCONTINUED] ondansetron **OR** ondansetron (ZOFRAN) IV, senna-docusate   Vital Signs    Vitals:   04/14/18 0441 04/14/18 1219 04/14/18 1953 04/15/18 0523  BP:  113/70 122/77 128/73  Pulse: (!) 124 71 70 70  Resp:  15 18 18   Temp:  98.3 F (36.8 C) (!) 97.5 F (36.4 C) 97.6 F (36.4 C)  TempSrc:  Oral Oral Oral  SpO2: 95% 96% 100% 100%  Weight:    85.2 kg  Height:        Intake/Output Summary (Last 24 hours) at 04/15/2018 0810 Last data filed at 04/14/2018 0951 Gross per 24 hour  Intake 0 ml  Output -  Net 0 ml   Filed Weights   04/13/18 0333 04/14/18 0350 04/15/18 0523  Weight: 85 kg 83.9 kg 85.2 kg    Telemetry    Sinus, atrial pacing and PVCs; NSVT- Personally Reviewed    Physical Exam   GEN: No acute distress.   Neck: No JVD Cardiac: RRR, no murmurs, rubs, or gallops.  Respiratory: Clear to auscultation bilaterally. GI: Soft, nontender MS: No edema Neuro:  Right hemiparesis   Labs    Chemistry Recent Labs  Lab 04/08/18 2115  04/12/18 0441 04/13/18 0440 04/14/18 0548  NA 138   < > 137 137 134*  K 5.9*   < > 3.5 3.5 4.0  CL 101   < > 99 100 96*  CO2 26   <  > 26 23 23   GLUCOSE 149*   < > 154* 162* 128*  BUN 34*   < > 33* 34* 32*  CREATININE 1.49*   < > 1.28* 1.28* 1.26*  CALCIUM 7.4*   < > 6.9* 6.9* 7.1*  PROT 7.4  --   --   --   --   ALBUMIN 3.4*  --   --   --   --   AST 21  --   --   --   --   ALT 8  --   --   --   --   ALKPHOS 34*  --   --   --   --   BILITOT 0.6  --   --   --   --   GFRNONAA 30*   < > 36* 36* 37*  GFRAA 35*   < > 42* 42* 43*  ANIONGAP 11   < > 12 14 15    < > = values in this interval not displayed.     Hematology Recent Labs  Lab 04/09/18 0646 04/10/18 0432 04/11/18 0724  WBC 7.8 7.7 8.4  RBC 3.75* 3.63* 3.78*  HGB 10.5* 10.3* 10.5*  HCT 35.3* 33.9* 35.5*  MCV 94.1 93.4 93.9  MCH 28.0 28.4 27.8  MCHC 29.7* 30.4 29.6*  RDW 16.1* 16.1* 16.1*  PLT 266 258 244    Cardiac Enzymes Recent Labs  Lab 04/09/18 0017 04/09/18 0646 04/09/18 1219  TROPONINI 0.11* 0.12* 0.14*    Recent Labs  Lab 04/08/18 2243  TROPIPOC 0.12*     BNP Recent Labs  Lab 04/09/18 0017 04/11/18 1442  BNP >4,500.0* >4,500.0*     Patient Profile     84 year old female with a past medical history of chronic systolic congestive heart failure, diabetes mellitus, prior CVA with residual right hemiparesis, prior pacemaker placement, hypothyroidism admitted with possible new CVA for evaluation of tachycardia and acute on chronic systolic congestive heart failure.  Echocardiogram shows severe LV dysfunction, biatrial enlargement, severe tricuspid regurgitation.  Electrocardiogram shows sinus rhythm with PVCs, ventricular pacing, paroxysmal atrial tachycardia, right bundle branch block and left anterior fascicular block.  Review of telemetry personally showed sinus rhythm with PVCs, ventricular pacing and intermittent paroxysmal atrial tachycardia with elevated heart rate.  Assessment & Plan    1 acute on chronic systolic congestive heart failure-patient appears to be euvolemic today.  Continue Lasix 40 mg daily.  Will need close  follow-up of renal function after discharge.  Needs fluid restriction to 1 L daily and low-sodium diet.  2 probable paroxysmal atrial tachycardia-no further PAT on telemetry.  We will continue higher dose carvedilol.  If she has more frequent episodes in the future could consider amiodarone.  3 prior pacemaker-normal function by interrogation.  4 mildly elevated troponin-no clear trend and not consistent with acute coronary syndrome.  Patient also not a candidate for aggressive cardiac evaluation.  5 CVA-continue aspirin and Plavix.  Per primary care.  6 cardiomyopathy-continue beta-blocker.  I will not add an ACE inhibitor, ARB, or hydralazine as blood pressure is borderline and she also has renal insufficiency.  Can be considered as an outpatient as tolerated by blood pressure.  7 acute on chronic stage III kidney disease-follow renal function with mild increased dose of Lasix.  For questions or updates, please contact [facility] Please consult [email or web address] for contact info under        Signed, Olga Millers, MD  04/15/2018, 8:10 AM

## 2018-04-15 NOTE — Progress Notes (Addendum)
Pt is discharged to [facility] via [facility] shortly, report provided to receiving nurse, pt's daughter is aware. IV take out, DC tele monitor, waiting for the PTAR this time.  3.44pm,  Pt left accompanied with PTAR, family members were in room, all papers provided to Roselyn Bering, RN

## 2018-04-15 NOTE — Discharge Instructions (Signed)

## 2018-04-15 NOTE — Clinical Social Work Note (Signed)
Clinical Social Worker facilitated patient discharge including contacting patient family and facility to confirm patient discharge plans.  Clinical information faxed to facility and family agreeable with plan.  CSW arranged ambulance transport via [facility] to [facility] (room 207 A).  RN to call [phone] for report prior to discharge.  Clinical Social Worker will sign off for now as social work intervention is no longer needed. Please consult [place] again if new need arises.  [place], [place] [phone]

## 2018-04-15 NOTE — Discharge Summary (Signed)
Physician Discharge Summary  Tina Waters ZOX:[id number] DOB: [birth year]  PCP: Eloisa Northern, MD  Admit date: 04/08/2018 Discharge date: 04/15/2018  Recommendations for Outpatient Follow-up:  1. MD at SNF in 2 days with repeat labs (CBC & BMP). 2. Repeating TSH in 4 weeks and adjust Synthroid dose as needed. 3. Dr. Eloisa Northern, PCP upon discharge from SNF. 4. Dr. Olga Millers, Cardiology  Home Health: Patient is going to [facility], SNF. Equipment/Devices: None  Discharge Condition: Improved and stable. CODE STATUS: Full Diet recommendation: Heart healthy & diabetic diet.  Discharge Diagnoses:  Principal Problem:   Acute on chronic systolic CHF (congestive heart failure) (HCC) Active Problems:   Hypothyroidism   Insulin-requiring or dependent type II diabetes mellitus (HCC)   AKI (acute kidney injury) (HCC)   Acute encephalopathy   History of CVA with residual deficit   Elevated troponin   Hyperkalemia   Elevated lactic acid level   Transient speech disturbance   Brief Summary: 84 year old female with PMH of hypothyroid, chronic systolic CHF (no prior EF on file on admission), DM 2, CVA with residual left hemiparesis, admitted on 9/1 after she presented from SNF with complaints of weakness, lethargy, dyspnea and reported transient dysarthria that resolved upon arrival to ED.  Prior to the admission, she was felt to be dehydrated at SNF and was given IV fluids.  Noted to be tachypneic, hypoxic in the 90s on room air, chest x-ray consistent with CHF, lab work showed mild hyperkalemia, creatinine of 1.49.  Initial CT head showed subacute versus chronic left basal ganglia infarct.  Unable to obtain MRI since patient has pacemaker.  Assessment & Plan:   Acute encephalopathy/transient dysarthria and question of worsening left-sided weakness: Patient has history of CVA with chronic left hemiplegia.  CT head x2 were nonacute.  MRI brain could not be done due to pacemaker.   Continued on aspirin and statin for secondary stroke prevention.  Mental status changes felt to be likely due to medications and dehydration.    Flexeril, Neurontin, Zanaflex and Effexor were discontinued.  Mental status changes resolved.  Avoid medications that are likely to precipitate mental status changes.  Acute on chronic combined systolic and diastolic CHF: TTE 9/1: LVEF 20-25% and grade 1 diastolic dysfunction.  Suspected dehydration at SNF.    Initially appeared dehydrated here as well with associated worsening creatinine.  Lasix was held and she was gently hydrated.  Her Lasix at home dose 20 mg daily was resumed.  However on 9/6, she appeared mildly dyspneic and volume overloaded.  Given her CHF history, renal insufficiency along with episodes of PSVT, Cardiology was consulted who increased her Lasix dose from 20 mg daily to 40 mg daily.  Her Imdur was discontinued to avoid hypotension while carvedilol was increased back to prior home dose.  She clinically improved.   Cardiology has seen her today, I have discussed with them and she appears to be euvolemic today.  Cardiology recommends continued Lasix 40 mg daily with close follow-up of renal function after discharge, fluid restriction to 1 L/day and low-sodium diet.  Continue carvedilol.  No ACEI or ARB due to renal insufficiency.    Cardiomyopathy: As per cardiology, continue beta-blocker.  ACEI/ARB/hydralazine were not added as blood pressure is borderline and she also has renal insufficiency.  This can be considered as outpatient as her blood pressure tolerates.  Suspected paroxysmal atrial tachycardia: Early on in admission, her carvedilol dose was reduced from 12.5-3.125 twice daily due to episode of hypotension  which was felt to be related due to dehydration and medications.  Subsequently her telemetry showed episodes of PAT.  Cardiology was consulted and increased her carvedilol back to prior home dose of 12.5 mg twice daily, discontinued  nitrates to avoid hypotension.  No further PAT on telemetry.  As per cardiology, if she has more frequent episodes of PAT in the future, would consider amiodarone.  Status post pacemaker: Interrogated and reportedly normal.  Also informed that this her pacemaker is not compatible for MRI.  Acute on stage III chronic kidney disease: Baseline creatinine reportedly 1.1.  Creatinine peaked to 1.5 on 9/3.  Lasix, ARB, Aldactone held.  Briefly hydrated with IV fluids.  Creatinine has improved and stable in the 1.2 range for the last 4 days.    ARB and Aldactone discontinued at discharge.  Lasix dose increased for CHF.  Follow BMP closely at SNF.  Hyperkalemia: Resolved.  ARB and Aldactone discontinued.  History of CVA with residual left hemiplegia: Discussion as indicated above.  Continue aspirin and statins.  Type II DM/IDDM: A1c 7.3.  Reasonable inpatient control on SSI alone without Levemir.  Discontinued Levemir and metformin at discharge to avoid hypoglycemia.  However if her CBG start to increase, may consider resuming low-dose Levemir.  Hypothyroid: TSH 12.56, free T4: 0.92.  As per previous notes, Synthroid 50 mcg listed on med rec but SNF unable to verify what dose patient was on prior to admit.  Continue current dose of Synthroid and will need to recheck TSH in 4 weeks or management as per MD at prior SNF.  Elevated troponin: Mild and flat trend.  No chest pain reported.  Could be due to renal insufficiency.  Continue aspirin, beta-blocker and statins. She is a poor candidate for invasive testing or intervention given her advanced age, poor baseline functional status from prior CVA and left-sided hemiplegia.    As per cardiology, her presentation is not consistent with acute coronary syndrome.  Anemia of chronic disease: Stable.  Hypomagnesemia: Magnesium 1.2.  Received IV magnesium 4 g early.    Magnesium improved to 1.7.   Consultants:  Cardiology  Procedures:   None   Discharge Instructions  Discharge Instructions    (HEART FAILURE PATIENTS) Call MD:  Anytime you have any of the following symptoms: 1) 3 pound weight gain in 24 hours or 5 pounds in 1 week 2) shortness of breath, with or without a dry hacking cough 3) swelling in the hands, feet or stomach 4) if you have to sleep on extra pillows at night in order to breathe.   Complete by:  As directed    Call MD for:   Complete by:  As directed    Confusion or altered mental status.   Call MD for:  difficulty breathing, headache or visual disturbances   Complete by:  As directed    Call MD for:  extreme fatigue   Complete by:  As directed    Call MD for:  persistant dizziness or light-headedness   Complete by:  As directed    Call MD for:  persistant nausea and vomiting   Complete by:  As directed    Call MD for:  severe uncontrolled pain   Complete by:  As directed    Call MD for:  temperature >100.4   Complete by:  As directed    Diet - low sodium heart healthy   Complete by:  As directed    Diet Carb Modified   Complete by:  As directed    Increase activity slowly   Complete by:  As directed        Medication List    STOP taking these medications   cyclobenzaprine 5 MG tablet Commonly known as:  FLEXERIL   gabapentin 300 MG capsule Commonly known as:  NEURONTIN   gabapentin 400 MG capsule Commonly known as:  NEURONTIN   isosorbide mononitrate 30 MG 24 hr tablet Commonly known as:  IMDUR   LEVEMIR FLEXTOUCH 100 UNIT/ML Pen Generic drug:  Insulin Detemir   losartan 100 MG tablet Commonly known as:  COZAAR   metFORMIN 500 MG tablet Commonly known as:  GLUCOPHAGE   spironolactone 25 MG tablet Commonly known as:  ALDACTONE   tiZANidine 2 MG tablet Commonly known as:  ZANAFLEX   venlafaxine XR 75 MG 24 hr capsule Commonly known as:  EFFEXOR-XR     TAKE these medications   acetaminophen 500 MG tablet Commonly known as:  TYLENOL Take 500 mg by mouth every 8  (eight) hours as needed for moderate pain.   ARTIFICIAL TEARS 0.2-0.2-1 % Soln Generic drug:  Glycerin-Hypromellose-PEG 400 Place 1 drop into both eyes 2 (two) times daily as needed (dry eyes).   aspirin EC 81 MG tablet Take 81 mg by mouth daily.   atorvastatin 20 MG tablet Commonly known as:  LIPITOR Take 20 mg by mouth at bedtime.   carvedilol 12.5 MG tablet Commonly known as:  COREG Take 12.5 mg by mouth 2 (two) times daily.   cholecalciferol 1000 units tablet Commonly known as:  VITAMIN D Take 1,000 Units by mouth daily.   cinacalcet 30 MG tablet Commonly known as:  SENSIPAR Take 30 mg by mouth 2 (two) times daily.   DERMACLOUD Crea Apply 1 application topically 3 (three) times daily. Apply to sacrum and buttocks topically every shift to protect skin from breakdown   furosemide 40 MG tablet Commonly known as:  LASIX Take 1 tablet (40 mg total) by mouth daily. What changed:  how much to take   insulin aspart 100 UNIT/ML injection Commonly known as:  novoLOG Inject 2-10 Units into the skin 4 (four) times daily - after meals and at bedtime. Per sliding scale if BS 151-200 = 2 units, 201-250 = 4 units, 251-300 = 6 units, 301-350 = 8 units, 351-400 = 10 units   levothyroxine 25 MCG tablet Commonly known as:  SYNTHROID, LEVOTHROID Take 50 mcg by mouth daily.   loperamide 2 MG tablet Commonly known as:  IMODIUM A-D Take 2 mg by mouth 4 (four) times daily as needed for diarrhea or loose stools.   nitroGLYCERIN 0.4 MG SL tablet Commonly known as:  NITROSTAT Place 0.4 mg under the tongue every 5 (five) minutes as needed for chest pain. For up to 3 doses. If no relief, call MD   pantoprazole 40 MG tablet Commonly known as:  PROTONIX Take 40 mg by mouth daily.       Contact information for follow-up providers    MD at SNF. Schedule an appointment as soon as possible for a visit in 2 day(s).   Why:  To be seen with repeat labs (CBC & BMP).       Eloisa Northern, MD.  Schedule an appointment as soon as possible for a visit.   Specialty:  Internal Medicine Why:  Upon discharge from SNF. Contact information: [address] [place] [address] [phone]        Lewayne Bunting, MD. Schedule  an appointment as soon as possible for a visit.   Specialty:  Cardiology Contact information: [address] [place] [address] [phone]            Contact information for after-discharge care    Destination    [facility] Preferred SNF .   Service:  Skilled Nursing Contact information: [address] [place] [address] [phone]                 Allergies  Allergen Reactions  . Penicillins     Has patient had a PCN reaction causing immediate rash, facial/tongue/throat swelling, SOB or lightheadedness with hypotension: Yes Has patient had a PCN reaction causing severe rash involving mucus membranes or skin necrosis: No Has patient had a PCN reaction that required hospitalization:No Has patient had a PCN reaction occurring within the last 10 years: No If all of the above answers are "NO", then may proceed with Cephalosporin use.      Procedures/Studies: Dg Chest 2 View  Result Date: 04/08/2018 CLINICAL DATA:  Pt BIB [facility] from [facility], staff noted pt has had decreased LOC since Thursday, hx dementia, CVA with deficits on the left. At baseline pt able to care for herself with assistance. At this time, pt answering questions appropriately EXAM: CHEST - 2 VIEW COMPARISON:  none FINDINGS: Low lung volumes. Suspect central pulmonary vascular congestion. Consolidation/atelectasis in the lung bases, left greater than right. Cardiomegaly. Left subclavian transvenous pacemaker. Aortic Atherosclerosis (ICD10-170.0). Moderate left and smaller right pleural effusions.  No pneumothorax. DJD in the left shoulder.  Hiatal hernia. IMPRESSION: 1. Cardiomegaly, pleural effusions, and  central pulmonary vascularity suggesting CHF. 2. Bibasilar consolidation/atelectasis, left greater than right. Electronically Signed   By: Corlis Leak M.D.   On: 04/08/2018 22:07   Ct Head Wo Contrast  Result Date: 04/10/2018 CLINICAL DATA:  84 year old female with new left basal ganglia infarct. Follow-up CT scan prior to discharge. EXAM: CT HEAD WITHOUT CONTRAST TECHNIQUE: Contiguous axial images were obtained from the base of the skull through the vertex without intravenous contrast. COMPARISON:  Prior head CT 04/08/2018 FINDINGS: Brain: No evidence of hemorrhagic transformation. Expected evolution of ill-defined low-attenuation in the inferior left basal ganglia and subinsular cortex. Stable cortical atrophy and chronic microvascular ischemic white matter disease. Vascular: No hyperdense vessel or unexpected calcification. Bilateral cavernous and supraclinoid carotid artery atherosclerotic calcifications. Skull: Normal. Negative for fracture or focal lesion. Sinuses/Orbits: No acute finding. Other: None. IMPRESSION: 1. No new acute intracranial abnormality or evidence of hemorrhagic conversion. 2. Expected evolution of left inferior basal ganglia and subinsular cortical infarct. Electronically Signed   By: Malachy Moan M.D.   On: 04/10/2018 12:12   Ct Head Wo Contrast  Result Date: 04/08/2018 CLINICAL DATA:  Patient with decreased level of consciousness. EXAM: CT HEAD WITHOUT CONTRAST TECHNIQUE: Contiguous axial images were obtained from the base of the skull through the vertex without intravenous contrast. COMPARISON:  None. FINDINGS: Brain: Patchy hypodensity within the left basal ganglia (image 15; series 3). Ventricles and sulci are prominent compatible with atrophy. No evidence for intracranial hemorrhage, mass lesion or mass-effect. Vascular: Internal carotid arterial vascular calcifications. Skull: Intact. Sinuses/Orbits: Paranasal sinuses are well aerated. Mastoid air cells are unremarkable.  Orbits unremarkable. Other: None. IMPRESSION: 1. Hypodensity within the left basal ganglia favored to represent subacute to chronic left basal ganglia infarct. Electronically Signed   By: Annia Belt M.D.   On: 04/08/2018 22:40   Dg Chest Port 1  View  Result Date: 04/11/2018 CLINICAL DATA:  Short of breath EXAM: PORTABLE CHEST 1 VIEW COMPARISON:  04/08/2018 FINDINGS: Left-sided pacing device as before. Cardiomegaly with vascular congestion and mild pulmonary edema. Bilateral pleural effusions. Bibasilar consolidations. Moderate to large hiatal hernia. Aortic atherosclerosis. No pneumothorax. Right shoulder deformity with discontinuity at the [facility] joint, humeral head deformity, and subluxed appearance of the right humeral head. IMPRESSION: 1. Cardiomegaly with vascular congestion and pulmonary edema, not significantly changed. 2. Moderate pleural effusions and bibasilar consolidations, unchanged 3. Large hiatal hernia Electronically Signed   By: Jasmine Pang M.D.   On: 04/11/2018 14:13      Subjective: Patient seen this morning.  Denies complaints.  Specifically denies dyspnea, chest pain or palpitations.  As per RN, no acute issues reported.  Discharge Exam:  Vitals:   04/14/18 1219 04/14/18 1953 04/15/18 0523 04/15/18 1217  BP: 113/70 122/77 128/73 118/71  Pulse: 71 70 70 70  Resp: 15 18 18 20   Temp: 98.3 F (36.8 C) (!) 97.5 F (36.4 C) 97.6 F (36.4 C) 97.9 F (36.6 C)  TempSrc: Oral Oral Oral Oral  SpO2: 96% 100% 100% 95%  Weight:   85.2 kg   Height:        General exam: Pleasant elderly female, moderately built and obese, lying comfortably propped up in bed.    Looks improved compared to distress and is in no obvious distress. Respiratory system:  Slightly diminished breath sounds in the bases with occasional basal crackles.  Rest of lung fields clear to auscultation.  No increased work of breathing. Cardiovascular system: S1 & S2 heard, RRR. No JVD, murmurs, rubs, gallops or  clicks.  Trace ankle edema.  Telemetry personally reviewed: Sinus rhythm, AV paced rhythm, no further episodes of significant PAT. Gastrointestinal system: Abdomen is nondistended, soft and nontender. No organomegaly or masses felt. Normal bowel sounds heard.  Central nervous system:  Alert and oriented to person, place and partly to time. No cranial nerve deficits noted.  No dysarthria or facial asymmetry.   Extremities: Grade 5 x 5 power in right limbs and grade 2 to 3 x 5 power left limbs with some contracture in the fingers and passive edema of hand.   Skin: No rashes, lesions or ulcers Psychiatry: Judgement and insight impaired. Mood & affect appropriate.     The results of significant diagnostics from this hospitalization (including imaging, microbiology, ancillary and laboratory) are listed below for reference.     Microbiology: Recent Results (from the past 240 hour(s))  Urine culture     Status: None   Collection Time: 04/08/18  8:29 PM  Result Value Ref Range Status   Specimen Description URINE, RANDOM  Final   Special Requests NONE  Final   Culture   Final    NO GROWTH Performed at [facility], [address], [place], [place] [address]    Report Status 04/10/2018 FINAL  Final  Culture, blood (Routine x 2)     Status: None   Collection Time: 04/08/18  9:15 PM  Result Value Ref Range Status   Specimen Description BLOOD BLOOD RIGHT FOREARM  Final   Special Requests   Final    BOTTLES DRAWN AEROBIC AND ANAEROBIC Blood Culture adequate volume   Culture   Final    NO GROWTH 5 DAYS Performed at [facility], [address], [place], [place] [address]    Report Status 04/13/2018 FINAL  Final  Culture, blood (Routine x 2)  Status: None   Collection Time: 04/08/18 10:20 PM  Result Value Ref Range Status   Specimen Description BLOOD RIGHT ANTECUBITAL  Final   Special Requests   Final    BOTTLES DRAWN AEROBIC AND ANAEROBIC Blood Culture results may not be  optimal due to an inadequate volume of blood received in culture bottles   Culture   Final    NO GROWTH 5 DAYS Performed at [facility], [address], [place], [place] [address]    Report Status 04/13/2018 FINAL  Final  MRSA PCR Screening     Status: None   Collection Time: 04/09/18  1:57 AM  Result Value Ref Range Status   MRSA by PCR NEGATIVE NEGATIVE Final    Comment:        The GeneXpert MRSA Assay (FDA approved for NASAL specimens only), is one component of a comprehensive MRSA colonization surveillance program. It is not intended to diagnose MRSA infection nor to guide or monitor treatment for MRSA infections. Performed at [facility], [address], [place], [place] [address]      Labs: CBC: Recent Labs  Lab 04/08/18 2115 04/09/18 0646 04/10/18 0432 04/11/18 0724  WBC 7.6 7.8 7.7 8.4  NEUTROABS 5.1  --   --   --   HGB 11.9* 10.5* 10.3* 10.5*  HCT 39.0 35.3* 33.9* 35.5*  MCV 93.8 94.1 93.4 93.9  PLT 269 266 258 244   Basic Metabolic Panel: Recent Labs  Lab 04/11/18 0724 04/12/18 0441 04/12/18 2021 04/13/18 0440 04/14/18 0548 04/14/18 1833 04/15/18 0637  NA 136 137  --  137 134*  --  137  K 5.0 3.5  --  3.5 4.0  --  3.9  CL 99 99  --  100 96*  --  97*  CO2 25 26  --  23 23  --  26  GLUCOSE 135* 154*  --  162* 128*  --  120*  BUN 36* 33*  --  34* 32*  --  31*  CREATININE 1.50* 1.28*  --  1.28* 1.26*  --  1.24*  CALCIUM 6.9* 6.9*  --  6.9* 7.1*  --  7.2*  MG 1.3*  --  1.2*  --   --  1.7  --    Liver Function Tests: Recent Labs  Lab 04/08/18 2115  AST 21  ALT 8  ALKPHOS 34*  BILITOT 0.6  PROT 7.4  ALBUMIN 3.4*   BNP (last 3 results) Recent Labs    04/09/18 0017 04/11/18 1442  BNP >4,500.0* >4,500.0*   Cardiac Enzymes: Recent Labs  Lab 04/09/18 0017 04/09/18 0646 04/09/18 1219  TROPONINI 0.11* 0.12* 0.14*   CBG: Recent Labs  Lab 04/14/18 1328 04/14/18 1620 04/14/18 2006 04/15/18 0741 04/15/18 1205  GLUCAP  131* 126* 129* 112* 149*   Urinalysis    Component Value Date/Time   COLORURINE YELLOW 04/08/2018 2029   APPEARANCEUR HAZY (A) 04/08/2018 2029   LABSPEC 1.017 04/08/2018 2029   PHURINE 5.0 04/08/2018 2029   GLUCOSEU NEGATIVE 04/08/2018 2029   HGBUR NEGATIVE 04/08/2018 2029   BILIRUBINUR NEGATIVE 04/08/2018 2029   KETONESUR NEGATIVE 04/08/2018 2029   PROTEINUR 30 (A) 04/08/2018 2029   NITRITE NEGATIVE 04/08/2018 2029   LEUKOCYTESUR NEGATIVE 04/08/2018 2029    I discussed in detail with patient's daughter and granddaughter via phone.  Updated care and answered all questions.  Time coordinating discharge: 60 minutes  SIGNED:  Marcellus Scott, MD, FACP, [facility]. [facility] Pager [phone]  If 7PM-7AM, please contact night-coverage [email or web address] Password [email or web address] 04/15/2018, 1:30 PM

## 2018-04-15 NOTE — Clinical Social Work Placement (Signed)
   CLINICAL SOCIAL WORK PLACEMENT  NOTE  Date:  04/15/2018  Patient Details  Name: Tina Waters MRN: [id number] Date of Birth: 1927/12/17  Clinical Social Work is seeking post-discharge placement for this patient at the Skilled  Nursing Facility level of care (*CSW will initial, date and re-position this form in  chart as items are completed):      Patient/family provided with [facility] Clinical Social Work Department's list of facilities offering this level of care within the geographic area requested by the patient (or if unable, by the patient's family).  Yes   Patient/family informed of their freedom to choose among providers that offer the needed level of care, that participate in Medicare, Medicaid or managed care program needed by the patient, have an available bed and are willing to accept the patient.      Patient/family informed of [facility]'s ownership interest in [facility] and [facility], as well as of the fact that they are under no obligation to receive care at these facilities.  PASRR submitted to EDS on       PASRR number received on 04/12/18     Existing PASRR number confirmed on       FL2 transmitted to all facilities in geographic area requested by pt/family on 04/12/18     FL2 transmitted to all facilities within larger geographic area on       Patient informed that his/her managed care company has contracts with or will negotiate with certain facilities, including the following:        Yes   Patient/family informed of bed offers received.  Patient chooses bed at [facility]     Physician recommends and patient chooses bed at      Patient to be transferred to [facility] on 04/15/18.  Patient to be transferred to facility by [facility]     Patient family notified on 04/15/18 of transfer.  Name of family member notified:  Hart Rochester, daughter     PHYSICIAN       Additional Comment:     _______________________________________________ Maree Krabbe, LCSW 04/15/2018, 1:48 PM

## 2018-04-17 ENCOUNTER — Inpatient Hospital Stay (HOSPITAL_COMMUNITY)
Admission: EM | Admit: 2018-04-17 | Discharge: 2018-04-20 | DRG: 175 | Disposition: A | Discharge status: Skilled Nursing Facility | Payer: Medicare Other | Attending: Internal Medicine | Admitting: Internal Medicine

## 2018-04-17 ENCOUNTER — Encounter (HOSPITAL_COMMUNITY): Payer: Self-pay | Admitting: Emergency Medicine

## 2018-04-17 ENCOUNTER — Emergency Department (HOSPITAL_COMMUNITY): Payer: Medicare Other

## 2018-04-17 ENCOUNTER — Other Ambulatory Visit: Payer: Self-pay

## 2018-04-17 DIAGNOSIS — I824Y9 Acute embolism and thrombosis of unspecified deep veins of unspecified proximal lower extremity: Secondary | ICD-10-CM

## 2018-04-17 DIAGNOSIS — E119 Type 2 diabetes mellitus without complications: Secondary | ICD-10-CM

## 2018-04-17 DIAGNOSIS — Z794 Long term (current) use of insulin: Secondary | ICD-10-CM

## 2018-04-17 DIAGNOSIS — N183 Chronic kidney disease, stage 3 unspecified: Secondary | ICD-10-CM | POA: Diagnosis present

## 2018-04-17 DIAGNOSIS — Z79899 Other long term (current) drug therapy: Secondary | ICD-10-CM

## 2018-04-17 DIAGNOSIS — J9811 Atelectasis: Secondary | ICD-10-CM | POA: Diagnosis present

## 2018-04-17 DIAGNOSIS — R402140 Coma scale, eyes open, spontaneous, unspecified time: Secondary | ICD-10-CM | POA: Diagnosis present

## 2018-04-17 DIAGNOSIS — D631 Anemia in chronic kidney disease: Secondary | ICD-10-CM | POA: Diagnosis present

## 2018-04-17 DIAGNOSIS — Z950 Presence of cardiac pacemaker: Secondary | ICD-10-CM

## 2018-04-17 DIAGNOSIS — F0390 Unspecified dementia without behavioral disturbance: Secondary | ICD-10-CM | POA: Diagnosis present

## 2018-04-17 DIAGNOSIS — R778 Other specified abnormalities of plasma proteins: Secondary | ICD-10-CM

## 2018-04-17 DIAGNOSIS — Z66 Do not resuscitate: Secondary | ICD-10-CM | POA: Diagnosis present

## 2018-04-17 DIAGNOSIS — I130 Hypertensive heart and chronic kidney disease with heart failure and stage 1 through stage 4 chronic kidney disease, or unspecified chronic kidney disease: Secondary | ICD-10-CM | POA: Diagnosis present

## 2018-04-17 DIAGNOSIS — R740 Nonspecific elevation of levels of transaminase and lactic acid dehydrogenase [LDH]: Secondary | ICD-10-CM | POA: Diagnosis present

## 2018-04-17 DIAGNOSIS — E039 Hypothyroidism, unspecified: Secondary | ICD-10-CM | POA: Diagnosis present

## 2018-04-17 DIAGNOSIS — I6930 Unspecified sequelae of cerebral infarction: Secondary | ICD-10-CM

## 2018-04-17 DIAGNOSIS — I272 Pulmonary hypertension, unspecified: Secondary | ICD-10-CM | POA: Diagnosis present

## 2018-04-17 DIAGNOSIS — R402250 Coma scale, best verbal response, oriented, unspecified time: Secondary | ICD-10-CM | POA: Diagnosis present

## 2018-04-17 DIAGNOSIS — I4510 Unspecified right bundle-branch block: Secondary | ICD-10-CM | POA: Diagnosis present

## 2018-04-17 DIAGNOSIS — I82412 Acute embolism and thrombosis of left femoral vein: Secondary | ICD-10-CM | POA: Diagnosis present

## 2018-04-17 DIAGNOSIS — I69354 Hemiplegia and hemiparesis following cerebral infarction affecting left non-dominant side: Secondary | ICD-10-CM

## 2018-04-17 DIAGNOSIS — Z7401 Bed confinement status: Secondary | ICD-10-CM

## 2018-04-17 DIAGNOSIS — I6381 Other cerebral infarction due to occlusion or stenosis of small artery: Secondary | ICD-10-CM | POA: Diagnosis present

## 2018-04-17 DIAGNOSIS — R4182 Altered mental status, unspecified: Secondary | ICD-10-CM

## 2018-04-17 DIAGNOSIS — G9341 Metabolic encephalopathy: Secondary | ICD-10-CM | POA: Diagnosis present

## 2018-04-17 DIAGNOSIS — R791 Abnormal coagulation profile: Secondary | ICD-10-CM | POA: Diagnosis present

## 2018-04-17 DIAGNOSIS — Z993 Dependence on wheelchair: Secondary | ICD-10-CM

## 2018-04-17 DIAGNOSIS — Z880 Allergy status to penicillin: Secondary | ICD-10-CM

## 2018-04-17 DIAGNOSIS — G9340 Encephalopathy, unspecified: Secondary | ICD-10-CM | POA: Diagnosis present

## 2018-04-17 DIAGNOSIS — R748 Abnormal levels of other serum enzymes: Secondary | ICD-10-CM | POA: Diagnosis not present

## 2018-04-17 DIAGNOSIS — J9600 Acute respiratory failure, unspecified whether with hypoxia or hypercapnia: Secondary | ICD-10-CM | POA: Diagnosis present

## 2018-04-17 DIAGNOSIS — Z7982 Long term (current) use of aspirin: Secondary | ICD-10-CM

## 2018-04-17 DIAGNOSIS — I2699 Other pulmonary embolism without acute cor pulmonale: Principal | ICD-10-CM | POA: Diagnosis present

## 2018-04-17 DIAGNOSIS — D649 Anemia, unspecified: Secondary | ICD-10-CM

## 2018-04-17 DIAGNOSIS — R7989 Other specified abnormal findings of blood chemistry: Secondary | ICD-10-CM | POA: Diagnosis present

## 2018-04-17 DIAGNOSIS — I5023 Acute on chronic systolic (congestive) heart failure: Secondary | ICD-10-CM | POA: Diagnosis present

## 2018-04-17 DIAGNOSIS — E1122 Type 2 diabetes mellitus with diabetic chronic kidney disease: Secondary | ICD-10-CM | POA: Diagnosis present

## 2018-04-17 DIAGNOSIS — R402360 Coma scale, best motor response, obeys commands, unspecified time: Secondary | ICD-10-CM | POA: Diagnosis present

## 2018-04-17 LAB — I-STAT TROPONIN, ED: Troponin i, poc: 0.13 ng/mL (ref 0.00–0.08)

## 2018-04-17 LAB — COMPREHENSIVE METABOLIC PANEL
ALK PHOS: 35 U/L — AB (ref 38–126)
ALT: 9 U/L (ref 0–44)
AST: 20 U/L (ref 15–41)
Albumin: 3.3 g/dL — ABNORMAL LOW (ref 3.5–5.0)
Anion gap: 14 (ref 5–15)
BUN: 38 mg/dL — ABNORMAL HIGH (ref 8–23)
CALCIUM: 7.8 mg/dL — AB (ref 8.9–10.3)
CO2: 27 mmol/L (ref 22–32)
CREATININE: 1.46 mg/dL — AB (ref 0.44–1.00)
Chloride: 97 mmol/L — ABNORMAL LOW (ref 98–111)
GFR calc non Af Amer: 31 mL/min — ABNORMAL LOW (ref >60)
GFR, EST AFRICAN AMERICAN: 36 mL/min — AB (ref >60)
Glucose, Bld: 153 mg/dL — ABNORMAL HIGH (ref 70–99)
Potassium: 4.2 mmol/L (ref 3.5–5.1)
Sodium: 138 mmol/L (ref 135–145)
TOTAL PROTEIN: 7.3 g/dL (ref 6.5–8.1)
Total Bilirubin: 1 mg/dL (ref 0.3–1.2)

## 2018-04-17 LAB — CBC WITH DIFFERENTIAL/PLATELET
ABS IMMATURE GRANULOCYTES: 0 10*3/uL (ref 0.0–0.1)
BASOS ABS: 0 10*3/uL (ref 0.0–0.1)
Basophils Relative: 0 %
Eosinophils Absolute: 0 10*3/uL (ref 0.0–0.7)
Eosinophils Relative: 0 %
HEMATOCRIT: 37.5 % (ref 36.0–46.0)
HEMOGLOBIN: 11.3 g/dL — AB (ref 12.0–15.0)
Immature Granulocytes: 1 %
LYMPHS ABS: 1.7 10*3/uL (ref 0.7–4.0)
LYMPHS PCT: 25 %
MCH: 27.8 pg (ref 26.0–34.0)
MCHC: 30.1 g/dL (ref 30.0–36.0)
MCV: 92.1 fL (ref 78.0–100.0)
MONOS PCT: 16 %
Monocytes Absolute: 1 10*3/uL (ref 0.1–1.0)
NEUTROS ABS: 3.9 10*3/uL (ref 1.7–7.7)
Neutrophils Relative %: 58 %
Platelets: 277 10*3/uL (ref 150–400)
RBC: 4.07 MIL/uL (ref 3.87–5.11)
RDW: 15.9 % — ABNORMAL HIGH (ref 11.5–15.5)
WBC: 6.7 10*3/uL (ref 4.0–10.5)

## 2018-04-17 LAB — BRAIN NATRIURETIC PEPTIDE: B Natriuretic Peptide: >4500 pg/mL — ABNORMAL HIGH (ref 0.0–100.0)

## 2018-04-17 NOTE — ED Triage Notes (Addendum)
Pt BIB [facility] from SNF, family and staff report pt changed from baseline. Pt c/o intermittent chest pain. EMS noted pt had runs of vtach. At baseline pt able to care for herself.

## 2018-04-17 NOTE — ED Notes (Signed)
Pacemaker on L chest interrogated by [facility].

## 2018-04-17 NOTE — ED Provider Notes (Signed)
[facility] EMERGENCY DEPARTMENT Provider Note   CSN: [id number] Arrival date & time: 04/17/18  2004     History   Chief Complaint Chief Complaint  Patient presents with  . Chest Pain  . Altered Mental Status    HPI Tina Waters is a 84 y.o. female.  Patient is an 84 year old female with a history of CHF with an EF of 20 to 25%, hypertension, stroke with chronic left-sided deficits, diabetes who was recently discharged from the hospital 2 days ago after being admitted for concern for possible stroke, CHF, dehydration and acute renal failure and multiple medication changes presenting back from skilled facility today for chest pain and altered mental status.  It was reported that patient was normal throughout today and approximately 1 hour prior to arrival she started complaining of chest pain.  Patient does not give further details about the pain but was given 1 nitroglycerin.  She indicated that that made the pain go away.  However shortly after patient became altered and was not as responsive as normal.  When EMS arrived patient was awake but was reacting slowly with diaphoresis and some tachypnea.  They noted ectopy on EKG.  Patient's oxygen and heart rate as well as blood pressure are within normal limits.  Patient is a poor historian but has no complaints at this time.  Prior to discharge patient had multiple mind altering medications discontinued as well as several diuretics discontinued.  She is taking Lasix daily but creatinine was to be watched closely.   The history is provided by the EMS personnel and the nursing home. The history is limited by the absence of a caregiver.  Chest Pain   This is a new problem. The current episode started 1 to 2 hours ago. The problem occurs constantly. The problem has been resolved. Associated symptoms include diaphoresis. Pertinent negatives include no abdominal pain and no nausea. Risk factors include being elderly.  Her past  medical history is significant for CHF, hypertension and strokes.  Altered Mental Status   Her past medical history is significant for hypertension.    Past Medical History:  Diagnosis Date  . CHF (congestive heart failure) (HCC)   . Hypertension   . Stroke (HCC)   . Thyroid disease     Patient Active Problem List   Diagnosis Date Noted  . Transient speech disturbance 04/09/2018  . Acute on chronic systolic CHF (congestive heart failure) (HCC) 04/08/2018  . Hypothyroidism 04/08/2018  . Insulin-requiring or dependent type II diabetes mellitus (HCC) 04/08/2018  . AKI (acute kidney injury) (HCC) 04/08/2018  . Acute encephalopathy 04/08/2018  . History of CVA with residual deficit 04/08/2018  . Elevated troponin 04/08/2018  . Hyperkalemia 04/08/2018  . Elevated lactic acid level 04/08/2018    History reviewed. No pertinent surgical history.   OB History   None      Home Medications    Prior to Admission medications   Medication Sig Start Date End Date Taking? Authorizing Provider  acetaminophen (TYLENOL) 500 MG tablet Take 500 mg by mouth every 8 (eight) hours as needed for moderate pain.    [provider]  aspirin EC 81 MG tablet Take 81 mg by mouth daily.    [provider]  atorvastatin (LIPITOR) 20 MG tablet Take 20 mg by mouth at bedtime.    [provider]  carvedilol (COREG) 12.5 MG tablet Take 12.5 mg by mouth 2 (two) times daily.    [provider]  cholecalciferol (  VITAMIN D) 1000 units tablet Take 1,000 Units by mouth daily.    [provider]  cinacalcet (SENSIPAR) 30 MG tablet Take 30 mg by mouth 2 (two) times daily.    [provider]  furosemide (LASIX) 40 MG tablet Take 1 tablet (40 mg total) by mouth daily. 04/15/18   Hongalgi, Maximino Greenland, MD  Glycerin-Hypromellose-PEG 400 (ARTIFICIAL TEARS) 0.2-0.2-1 % SOLN Place 1 drop into both eyes 2 (two) times daily as needed (dry eyes).    [provider]    Infant Care Products (DERMACLOUD) CREA Apply 1 application topically 3 (three) times daily. Apply to sacrum and buttocks topically every shift to protect skin from breakdown    [provider]  insulin aspart (NOVOLOG) 100 UNIT/ML injection Inject 2-10 Units into the skin 4 (four) times daily - after meals and at bedtime. Per sliding scale if BS 151-200 = 2 units, 201-250 = 4 units, 251-300 = 6 units, 301-350 = 8 units, 351-400 = 10 units    [provider]  levothyroxine (SYNTHROID, LEVOTHROID) 25 MCG tablet Take 50 mcg by mouth daily.     [provider]  loperamide (IMODIUM A-D) 2 MG tablet Take 2 mg by mouth 4 (four) times daily as needed for diarrhea or loose stools.    [provider]  nitroGLYCERIN (NITROSTAT) 0.4 MG SL tablet Place 0.4 mg under the tongue every 5 (five) minutes as needed for chest pain. For up to 3 doses. If no relief, call MD    [provider]  pantoprazole (PROTONIX) 40 MG tablet Take 40 mg by mouth daily.    [provider]    Family History Family History  Problem Relation Age of Onset  . Obesity Daughter     Social History Social History   Tobacco Use  . Smoking status: Never Smoker  . Smokeless tobacco: Never Used  Substance Use Topics  . Alcohol use: No    Frequency: Never  . Drug use: No     Allergies   Penicillins   Review of Systems Review of Systems  Unable to perform ROS: Dementia  Constitutional: Positive for diaphoresis.  Cardiovascular: Positive for chest pain.  Gastrointestinal: Negative for abdominal pain and nausea.     Physical Exam Updated Vital Signs BP 130/83   Pulse 71   Resp (!) 26   SpO2 100%   Physical Exam  Constitutional: She appears well-developed and well-nourished. No distress.  Minimal diaphoresis  HENT:  Head: Normocephalic and atraumatic.  Mouth/Throat: Oropharynx is clear and moist.  Eyes: Pupils are equal, round, and reactive to light. Conjunctivae  and EOM are normal.  Neck: Normal range of motion. Neck supple.  Cardiovascular: Normal rate, regular rhythm and intact distal pulses.  No murmur heard. Pulmonary/Chest: Effort normal. No respiratory distress. She has decreased breath sounds in the right lower field and the left lower field. She has no wheezes. She has no rales.  Pacemaker in the left upper chest  Abdominal: Soft. She exhibits no distension. There is no tenderness. There is no rebound and no guarding.  Musculoskeletal: She exhibits edema. She exhibits no tenderness.  Chronic left sided weakness.  Trace bilateral edema  Neurological: She is alert.  Only anwering yes and no questions.  Chronic left sided weakness.  No dysarthria or aphasia noted  Skin: Skin is warm and dry. No rash noted. No erythema.  Psychiatric: She has a normal mood and affect. Her behavior is normal.  Nursing note and vitals  reviewed.    ED Treatments / Results  Labs (all labs ordered are listed, but only abnormal results are displayed) Labs Reviewed  CBC WITH DIFFERENTIAL/PLATELET - Abnormal; Notable for the following components:      Result Value   Hemoglobin 11.3 (*)    RDW 15.9 (*)    All other components within normal limits  COMPREHENSIVE METABOLIC PANEL - Abnormal; Notable for the following components:   Chloride 97 (*)    Glucose, Bld 153 (*)    BUN 38 (*)    Creatinine, Ser 1.46 (*)    Calcium 7.8 (*)    Albumin 3.3 (*)    Alkaline Phosphatase 35 (*)    GFR calc non Af Amer 31 (*)    GFR calc Af Amer 36 (*)    All other components within normal limits  I-STAT TROPONIN, ED - Abnormal; Notable for the following components:   Troponin i, poc 0.13 (*)    All other components within normal limits  I-STAT CHEM 8, ED - Abnormal; Notable for the following components:   Sodium 131 (*)    Potassium 7.9 (*)    BUN 61 (*)    Creatinine, Ser 1.60 (*)    Glucose, Bld 159 (*)    Calcium, Ion 0.66 (*)    All other components within normal  limits  BRAIN NATRIURETIC PEPTIDE    EKG EKG Interpretation  Date/Time:  Monday April 17 2018 20:12:17 EDT Ventricular Rate:  70 PR Interval:    QRS Duration: 140 QT Interval:  474 QTC Calculation: 512 R Axis:   -65 Text Interpretation:  Atrial-paced rhythm Nonspecific IVCD with LAD LVH with secondary repolarization abnormality Anterior Q waves, possibly due to LVH No significant change since last tracing Confirmed by Gwyneth Sprout ([id number]) on 04/17/2018 8:28:19 PM   Radiology Dg Chest Port 1 View  Result Date: 04/17/2018 CLINICAL DATA:  Altered mental status, intermittent chest pain, history CHF, hypertension, stroke EXAM: PORTABLE CHEST 1 VIEW COMPARISON:  Portable exam 2007 hours compared to 04/11/2018 FINDINGS: LEFT subclavian sequential pacemaker with leads projecting over enlarged RIGHT atrium and RIGHT ventricle. Enlargement of cardiac silhouette. Mediastinal contours and pulmonary vascularity normal. Atherosclerotic calcification aorta. Persistent atelectasis versus consolidation of LEFT lower lobe. Improved aeration at RIGHT base since previous exam, with minimal residual RIGHT pleural effusion and basilar atelectasis. No pneumothorax. 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 IMPRESSION: Enlargement of cardiac silhouette post pacemaker. Persistent atelectasis versus consolidation LEFT lower lobe. Improved aeration RIGHT base. Electronically Signed   By: Ulyses Southward M.D.   On: 04/17/2018 20:28    Procedures Procedures (including critical care time)  Medications Ordered in ED Medications - No data to display   Initial Impression / Assessment and Plan / ED Course  I have reviewed the triage vital signs and the nursing notes.  Pertinent labs & imaging results that were available during my care of the patient were reviewed by me and considered in my medical decision making (see  chart for details).     Elderly female with multiple medical problems returning today for chest pain and altered mental status.  Patient was discharged 2 days ago after having similar complaints.  During her last hospitalization she was found to be in acute renal failure in the setting of CHF with a ejection fraction of 25%.  Patient does take nitroglycerin and Imdur for chronic chest pain.  She was slightly diaphoretic upon  EMS arrival but vital signs were within normal limits.  Patient's blood sugar was within normal limits.  Patient was noted to have ectopy on monitor but was still being paced.  Patient is not displaying any signs concerning for acute stroke at this time.  She is not on mind altering medications.  Possible that patient had a brief hypotensive episode after having nitroglycerin earlier given her fluctuating blood pressure which was noted during her hospital stay.  Concern for worsening kidney insufficiency.  Interrogated pacemaker and they noted to episodes of atrial tachycardia but nothing within the last few hours that brought patient here.  Patient's chest x-ray shows enlargement of the cardiac silhouette with persistent atelectasis versus consolidation in the left lower lobe but proved aeration of the right base.  Patient had no report of infectious symptoms at this time.  11:11 PM Since family has arrived and states since discharge 2 days ago she has had some difficulty communicating and has not been able to speak the way she did prior to her hospitalization but tonight she started having decreased responsiveness and intermittent grunting when trying to breathe.  She is not able to communicate if she is having any pain.  They state this is very different from her.  She does not usually use oxygen at the facility but they put some on her tonight because she appeared to have difficulty breathing.  Patient does not wear chronic oxygen normally and did not leave the hospital with oxygen.   On repeat exam patient is more altered and more tachypneic.  Some fine crackles noted but chest x-ray shows improved aeration.  Blood pressure remained stable.  Patient's pupils are reactive and she is moving upper extremities.  CMP with stable creatinine and normal potassium.  Initial troponin is elevated at 0.13 which is similar to prior troponins during her last hospitalization.  Hemoglobin is stable and white count is normal.  After speaking with the family more we will add on a VBG to ensure no hypercarbia, BNP is still pending, ammonia, lactic acid and d-dimer for further evaluation of patient's shortness of breath which seems to be the most troubling thing for the patient's family members which is new tonight.  Also will get a UA to ensure no developing infection.  Final Clinical Impressions(s) / ED Diagnoses   Final diagnoses:  None    ED Discharge Orders    None       Gwyneth Sprout, MD 04/18/18 1351

## 2018-04-17 NOTE — ED Notes (Signed)
Neuro reassessment made on patient, family concerned of pt grunting and not responding to voice. Provider notified

## 2018-04-18 ENCOUNTER — Ambulatory Visit (HOSPITAL_BASED_OUTPATIENT_CLINIC_OR_DEPARTMENT_OTHER): Payer: Medicare Other

## 2018-04-18 ENCOUNTER — Emergency Department (HOSPITAL_COMMUNITY): Payer: Medicare Other

## 2018-04-18 ENCOUNTER — Observation Stay (HOSPITAL_COMMUNITY): Payer: Medicare Other

## 2018-04-18 ENCOUNTER — Encounter (HOSPITAL_COMMUNITY): Payer: Self-pay | Admitting: Internal Medicine

## 2018-04-18 DIAGNOSIS — I5023 Acute on chronic systolic (congestive) heart failure: Secondary | ICD-10-CM | POA: Diagnosis not present

## 2018-04-18 DIAGNOSIS — E119 Type 2 diabetes mellitus without complications: Secondary | ICD-10-CM

## 2018-04-18 DIAGNOSIS — N183 Chronic kidney disease, stage 3 unspecified: Secondary | ICD-10-CM | POA: Diagnosis present

## 2018-04-18 DIAGNOSIS — R609 Edema, unspecified: Secondary | ICD-10-CM | POA: Diagnosis not present

## 2018-04-18 DIAGNOSIS — M79609 Pain in unspecified limb: Secondary | ICD-10-CM | POA: Diagnosis not present

## 2018-04-18 DIAGNOSIS — G9340 Encephalopathy, unspecified: Secondary | ICD-10-CM

## 2018-04-18 DIAGNOSIS — M7989 Other specified soft tissue disorders: Secondary | ICD-10-CM

## 2018-04-18 DIAGNOSIS — Z794 Long term (current) use of insulin: Secondary | ICD-10-CM

## 2018-04-18 LAB — COMPREHENSIVE METABOLIC PANEL
ALT: 10 U/L (ref 0–44)
AST: 19 U/L (ref 15–41)
Albumin: 3.3 g/dL — ABNORMAL LOW (ref 3.5–5.0)
Alkaline Phosphatase: 33 U/L — ABNORMAL LOW (ref 38–126)
Anion gap: 15 (ref 5–15)
BUN: 39 mg/dL — AB (ref 8–23)
CHLORIDE: 100 mmol/L (ref 98–111)
CO2: 25 mmol/L (ref 22–32)
Calcium: 7.7 mg/dL — ABNORMAL LOW (ref 8.9–10.3)
Creatinine, Ser: 1.46 mg/dL — ABNORMAL HIGH (ref 0.44–1.00)
GFR calc Af Amer: 36 mL/min — ABNORMAL LOW (ref >60)
GFR calc non Af Amer: 31 mL/min — ABNORMAL LOW (ref >60)
Glucose, Bld: 152 mg/dL — ABNORMAL HIGH (ref 70–99)
POTASSIUM: 3.7 mmol/L (ref 3.5–5.1)
Sodium: 140 mmol/L (ref 135–145)
Total Bilirubin: 0.9 mg/dL (ref 0.3–1.2)
Total Protein: 7.1 g/dL (ref 6.5–8.1)

## 2018-04-18 LAB — CBC WITH DIFFERENTIAL/PLATELET
ABS IMMATURE GRANULOCYTES: 0 10*3/uL (ref 0.0–0.1)
BASOS ABS: 0 10*3/uL (ref 0.0–0.1)
Basophils Relative: 1 %
Eosinophils Absolute: 0.1 10*3/uL (ref 0.0–0.7)
Eosinophils Relative: 1 %
HEMATOCRIT: 38.3 % (ref 36.0–46.0)
HEMOGLOBIN: 11.2 g/dL — AB (ref 12.0–15.0)
Immature Granulocytes: 1 %
LYMPHS ABS: 1.7 10*3/uL (ref 0.7–4.0)
LYMPHS PCT: 23 %
MCH: 27.7 pg (ref 26.0–34.0)
MCHC: 29.2 g/dL — AB (ref 30.0–36.0)
MCV: 94.6 fL (ref 78.0–100.0)
MONO ABS: 1.2 10*3/uL — AB (ref 0.1–1.0)
Monocytes Relative: 17 %
NEUTROS ABS: 4.4 10*3/uL (ref 1.7–7.7)
Neutrophils Relative %: 59 %
Platelets: 256 10*3/uL (ref 150–400)
RBC: 4.05 MIL/uL (ref 3.87–5.11)
RDW: 16.1 % — ABNORMAL HIGH (ref 11.5–15.5)
WBC: 7.5 10*3/uL (ref 4.0–10.5)

## 2018-04-18 LAB — I-STAT TROPONIN, ED: Troponin i, poc: 0.11 ng/mL (ref 0.00–0.08)

## 2018-04-18 LAB — I-STAT CHEM 8, ED
BUN: 61 mg/dL — ABNORMAL HIGH (ref 8–23)
CHLORIDE: 101 mmol/L (ref 98–111)
Calcium, Ion: 0.66 mmol/L — CL (ref 1.15–1.40)
Creatinine, Ser: 1.6 mg/dL — ABNORMAL HIGH (ref 0.44–1.00)
Glucose, Bld: 159 mg/dL — ABNORMAL HIGH (ref 70–99)
HCT: 40 % (ref 36.0–46.0)
Hemoglobin: 13.6 g/dL (ref 12.0–15.0)
Potassium: 7.9 mmol/L (ref 3.5–5.1)
Sodium: 131 mmol/L — ABNORMAL LOW (ref 135–145)
TCO2: 26 mmol/L (ref 22–32)

## 2018-04-18 LAB — URINALYSIS, ROUTINE W REFLEX MICROSCOPIC
Bilirubin Urine: NEGATIVE
Glucose, UA: NEGATIVE mg/dL
HGB URINE DIPSTICK: NEGATIVE
KETONES UR: NEGATIVE mg/dL
Leukocytes, UA: NEGATIVE
NITRITE: NEGATIVE
Protein, ur: NEGATIVE mg/dL
SPECIFIC GRAVITY, URINE: 1.011 (ref 1.005–1.030)
pH: 5 (ref 5.0–8.0)

## 2018-04-18 LAB — CBG MONITORING, ED: Glucose-Capillary: 145 mg/dL — ABNORMAL HIGH (ref 70–99)

## 2018-04-18 LAB — GLUCOSE, CAPILLARY
GLUCOSE-CAPILLARY: 120 mg/dL — AB (ref 70–99)
GLUCOSE-CAPILLARY: 143 mg/dL — AB (ref 70–99)
GLUCOSE-CAPILLARY: 134 mg/dL — AB (ref 70–99)

## 2018-04-18 LAB — I-STAT CG4 LACTIC ACID, ED
LACTIC ACID, VENOUS: 2.61 mmol/L — AB (ref 0.5–1.9)
Lactic Acid, Venous: 2.58 mmol/L (ref 0.5–1.9)

## 2018-04-18 LAB — I-STAT VENOUS BLOOD GAS, ED
ACID-BASE EXCESS: 6 mmol/L — AB (ref 0.0–2.0)
Bicarbonate: 32.2 mmol/L — ABNORMAL HIGH (ref 20.0–28.0)
O2 SAT: 43 %
PH VEN: 7.385 (ref 7.250–7.430)
TCO2: 34 mmol/L — ABNORMAL HIGH (ref 22–32)
pCO2, Ven: 53.7 mmHg (ref 44.0–60.0)
pO2, Ven: 25 mmHg — CL (ref 32.0–45.0)

## 2018-04-18 LAB — LACTIC ACID, PLASMA
LACTIC ACID, VENOUS: 2.5 mmol/L — AB (ref 0.5–1.9)
Lactic Acid, Venous: 2.6 mmol/L (ref 0.5–1.9)

## 2018-04-18 LAB — HEPARIN LEVEL (UNFRACTIONATED): HEPARIN UNFRACTIONATED: 1.64 [IU]/mL — AB (ref 0.30–0.70)

## 2018-04-18 LAB — TSH: TSH: 11.074 u[IU]/mL — ABNORMAL HIGH (ref 0.350–4.500)

## 2018-04-18 LAB — TROPONIN I: Troponin I: 0.12 ng/mL (ref <0.03)

## 2018-04-18 LAB — AMMONIA: Ammonia: 33 umol/L (ref 9–35)

## 2018-04-18 LAB — D-DIMER, QUANTITATIVE (NOT AT ARMC): D DIMER QUANT: 4.78 ug{FEU}/mL — AB (ref 0.00–0.50)

## 2018-04-18 LAB — PROCALCITONIN: Procalcitonin: <0.1 ng/mL

## 2018-04-18 MED ORDER — HEPARIN BOLUS VIA INFUSION
4000.0000 [IU] | Freq: Once | INTRAVENOUS | Status: AC
  Administered 2018-04-18: 4000 [IU] via INTRAVENOUS
  Filled 2018-04-18: qty 4000

## 2018-04-18 MED ORDER — CARVEDILOL 12.5 MG PO TABS
12.5000 mg | ORAL_TABLET | Freq: Two times a day (BID) | ORAL | Status: DC
  Administered 2018-04-18 – 2018-04-20 (×3): 12.5 mg via ORAL
  Filled 2018-04-18 (×4): qty 1

## 2018-04-18 MED ORDER — LEVOTHYROXINE SODIUM 100 MCG IV SOLR
25.0000 ug | Freq: Every day | INTRAVENOUS | Status: DC
  Administered 2018-04-19: 25 ug via INTRAVENOUS
  Filled 2018-04-18 (×2): qty 5

## 2018-04-18 MED ORDER — ACETAMINOPHEN 650 MG RE SUPP: 650.0000 mg | Freq: Four times a day (QID) | RECTAL | Status: DC | PRN

## 2018-04-18 MED ORDER — INSULIN ASPART 100 UNIT/ML ~~LOC~~ SOLN
0.0000 [IU] | SUBCUTANEOUS | Status: DC
  Administered 2018-04-18 (×2): 1 [IU] via SUBCUTANEOUS
  Administered 2018-04-19: 2 [IU] via SUBCUTANEOUS
  Administered 2018-04-19 (×2): 1 [IU] via SUBCUTANEOUS
  Administered 2018-04-19: 2 [IU] via SUBCUTANEOUS
  Filled 2018-04-18: qty 1

## 2018-04-18 MED ORDER — HEPARIN (PORCINE) IN NACL 100-0.45 UNIT/ML-% IJ SOLN
1000.0000 [IU]/h | INTRAMUSCULAR | Status: DC
  Administered 2018-04-18: 1200 [IU]/h via INTRAVENOUS
  Administered 2018-04-18 – 2018-04-19 (×2): 1000 [IU]/h via INTRAVENOUS
  Filled 2018-04-18 (×2): qty 250

## 2018-04-18 MED ORDER — VANCOMYCIN HCL IN DEXTROSE 1-5 GM/200ML-% IV SOLN: 1000.0000 mg | INTRAVENOUS | Status: DC

## 2018-04-18 MED ORDER — ONDANSETRON HCL 4 MG PO TABS: 4.0000 mg | ORAL_TABLET | Freq: Four times a day (QID) | ORAL | Status: DC | PRN

## 2018-04-18 MED ORDER — ACETAMINOPHEN 325 MG PO TABS
650.0000 mg | ORAL_TABLET | Freq: Four times a day (QID) | ORAL | Status: DC | PRN
  Administered 2018-04-18 – 2018-04-19 (×3): 650 mg via ORAL
  Filled 2018-04-18 (×3): qty 2

## 2018-04-18 MED ORDER — VANCOMYCIN HCL 10 G IV SOLR
1500.0000 mg | Freq: Once | INTRAVENOUS | Status: DC
  Administered 2018-04-18: 1500 mg via INTRAVENOUS
  Filled 2018-04-18: qty 1500

## 2018-04-18 MED ORDER — ONDANSETRON HCL 4 MG/2ML IJ SOLN
4.0000 mg | Freq: Four times a day (QID) | INTRAMUSCULAR | Status: DC | PRN
  Administered 2018-04-19: 4 mg via INTRAVENOUS
  Filled 2018-04-18: qty 2

## 2018-04-18 MED ORDER — SODIUM CHLORIDE 0.9 % IV SOLN
1.0000 g | Freq: Three times a day (TID) | INTRAVENOUS | Status: DC
  Administered 2018-04-18: 1 g via INTRAVENOUS
  Filled 2018-04-18 (×2): qty 1

## 2018-04-18 MED ORDER — TECHNETIUM TC 99M DIETHYLENETRIAME-PENTAACETIC ACID
31.0000 | Freq: Once | INTRAVENOUS | Status: AC | PRN
  Administered 2018-04-18: 31 via RESPIRATORY_TRACT

## 2018-04-18 MED ORDER — VANCOMYCIN HCL 10 G IV SOLR
1500.0000 mg | Freq: Once | INTRAVENOUS | Status: DC
  Filled 2018-04-18: qty 1500

## 2018-04-18 MED ORDER — TECHNETIUM TO 99M ALBUMIN AGGREGATED
4.2500 | Freq: Once | INTRAVENOUS | Status: AC | PRN
  Administered 2018-04-18: 4.25 via INTRAVENOUS

## 2018-04-18 MED ORDER — SODIUM CHLORIDE 0.9% FLUSH: 10.0000 mL | INTRAVENOUS | Status: DC | PRN

## 2018-04-18 NOTE — Progress Notes (Signed)
ANTICOAGULATION CONSULT NOTE - Initial Consult  Pharmacy Consult for heparin Indication: r/o PE  Allergies  Allergen Reactions  . Penicillins     Has patient had a PCN reaction causing immediate rash, facial/tongue/throat swelling, SOB or lightheadedness with hypotension: Yes Has patient had a PCN reaction causing severe rash involving mucus membranes or skin necrosis: No Has patient had a PCN reaction that required hospitalization:No Has patient had a PCN reaction occurring within the last 10 years: No If all of the above answers are "NO", then may proceed with Cephalosporin use.    Patient Measurements: Height: 5\' 2"  (157.5 cm) Weight: 187 lb 13.3 oz (85.2 kg) IBW/kg (Calculated) : 50.1 Heparin Dosing Weight: 70kg  Vital Signs: Temp: 98.4 F (36.9 C) (09/10 0011) Temp Source: Rectal (09/10 0011) BP: 137/68 (09/10 0345) Pulse Rate: 70 (09/10 0345)  Labs: Recent Labs    04/15/18 0637 04/17/18 2130 04/17/18 2141  HGB  --  13.6 11.3*  HCT  --  40.0 37.5  PLT  --   --  277  CREATININE 1.24* 1.60* 1.46*    Estimated Creatinine Clearance: 27.5 mL/min (A) (by C-G formula based on SCr of 1.46 mg/dL (H)).   Medical History: Past Medical History:  Diagnosis Date  . CHF (congestive heart failure) (HCC)   . Hypertension   . Stroke (HCC)   . Thyroid disease     Assessment: 84yo female c/o intermittent CP, EMS notes runs of Vtach, troponin and D-dimer elevated, awaiting VQ scan, to begin heparin for possible PE.  Goal of Therapy:  Heparin level 0.3-0.7 units/ml Monitor platelets by anticoagulation protocol: Yes   Plan:  Will give heparin 4000 units IV bolus x1 followed by gtt at 1200 units/hr and monitor heparin levels and CBC.  Vernard Gambles, PharmD, BCPS  04/18/2018,3:54 AM

## 2018-04-18 NOTE — ED Notes (Signed)
Patient went for VQ scan, then will be transported upstairs/

## 2018-04-18 NOTE — Progress Notes (Signed)
Preliminary notes--Bilateral lower extremities venous duplex exam completed. Positive for acute deep veins thrombosis involving the left common femoral vein, left femoral vein, and left peroneal vein.  A non-vascular cystic structure seen at popliteal fossa medial aspect with measurement of 2.92x2.22x1.68cm. Calcification noted at lower extremity arteries.  Hongying Maris Abascal (RDMS RVT) 04/18/18 1:07 PM

## 2018-04-18 NOTE — Progress Notes (Signed)
ANTICOAGULATION CONSULT NOTE Pharmacy Consult for heparin Indication: PE (intermediate probability)  Allergies  Allergen Reactions  . Penicillins Other (See Comments)    Has patient had a PCN reaction causing immediate rash, facial/tongue/throat swelling, SOB or lightheadedness with hypotension: Yes Has patient had a PCN reaction causing severe rash involving mucus membranes or skin necrosis: No Has patient had a PCN reaction that required hospitalization: No Has patient had a PCN reaction occurring within the last 10 years: No If all of the above answers are "NO", then may proceed with Cephalosporin use.    Patient Measurements: Height: 5\' 2"  (157.5 cm) Weight: 187 lb 13.3 oz (85.2 kg) IBW/kg (Calculated) : 50.1 Heparin Dosing Weight: 70kg  Vital Signs: Temp: 97.4 F (36.3 C) (09/10 1500) Temp Source: Rectal (09/10 1500) BP: 93/45 (09/10 1500) Pulse Rate: 71 (09/10 1500)  Labs: Recent Labs    04/17/18 2130 04/17/18 2141 04/18/18 0851 04/18/18 1438  HGB 13.6 11.3* 11.2*  --   HCT 40.0 37.5 38.3  --   PLT  --  277 256  --   HEPARINUNFRC  --   --   --  1.64*  CREATININE 1.60* 1.46* 1.46*  --   TROPONINI  --   --  0.12*  --     Estimated Creatinine Clearance: 27.5 mL/min (A) (by C-G formula based on SCr of 1.46 mg/dL (H)).   Medical History: Past Medical History:  Diagnosis Date  . CHF (congestive heart failure) (HCC)   . Hypertension   . Stroke (HCC)   . Thyroid disease     Assessment: 84yo female c/o intermittent CP, EMS notes runs of Vtach, troponin and D-dimer elevated, to continue heparin for intermediate probability of PE.  Initial heparin level elevated at 1.64  Goal of Therapy:  Heparin level 0.3-0.7 units/ml Monitor platelets by anticoagulation protocol: Yes   Plan:  Hold heparin x 1 hour Decrease heparin to 1000 units / hr  8 hour heparin level  Thank you Okey Regal, PharmD [phone] 04/18/2018,4:02 PM

## 2018-04-18 NOTE — ED Notes (Signed)
POA Daughter, [phone]

## 2018-04-18 NOTE — ED Notes (Signed)
Report given to 3E RN

## 2018-04-18 NOTE — Progress Notes (Signed)
Pharmacy Antibiotic Note  Tina Waters is a 84 y.o. female admitted on 04/17/2018 with possible pneumonia after recent discharge.  Pharmacy has been consulted for vancomycin and aztreonam dosing.  Plan: Vancomycin 1500mg  x1 then 1000mg  IV every 24 hours.  Goal trough 15-20 mcg/mL.  Aztreonam 1g IV every 8 hours.  Height: 5\' 2"  (157.5 cm) Weight: 187 lb 13.3 oz (85.2 kg) IBW/kg (Calculated) : 50.1  Temp (24hrs), Avg:98.4 F (36.9 C), Min:98.4 F (36.9 C), Max:98.4 F (36.9 C)  Recent Labs  Lab 04/11/18 0724  04/13/18 0440 04/14/18 0548 04/15/18 0637 04/17/18 2130 04/17/18 2141 04/18/18 0133  WBC 8.4  --   --   --   --   --  6.7  --   CREATININE 1.50*   < > 1.28* 1.26* 1.24* 1.60* 1.46*  --   LATICACIDVEN  --   --   --   --   --   --   --  2.61*   < > = values in this interval not displayed.    Estimated Creatinine Clearance: 27.5 mL/min (A) (by C-G formula based on SCr of 1.46 mg/dL (H)).    Allergies  Allergen Reactions  . Penicillins Other (See Comments)    Has patient had a PCN reaction causing immediate rash, facial/tongue/throat swelling, SOB or lightheadedness with hypotension: Yes Has patient had a PCN reaction causing severe rash involving mucus membranes or skin necrosis: No Has patient had a PCN reaction that required hospitalization: No Has patient had a PCN reaction occurring within the last 10 years: No If all of the above answers are "NO", then may proceed with Cephalosporin use.    Thank you for allowing pharmacy to be a part of this patient's care.  Vernard Gambles, PharmD, BCPS  04/18/2018 6:50 AM

## 2018-04-18 NOTE — H&P (Signed)
History and Physical    Tina Waters ZOX:[id number] DOB: 09/23/1927 DOA: 04/17/2018  PCP: Eloisa Northern, MD  Patient coming from: Skilled nursing facility.  Chief Complaint: Altered mental status and chest pain.  HPI: Tina Waters is a 84 y.o. female with history of chronic systolic heart failure last EF measured last week was 20 to 25%, history of pacemaker placement and history of stroke with left-sided weakness hypothyroidism diabetes mellitus type 2, chronic kidney disease, chronic anemia who was recently admitted and discharged 2 days ago after being managed for CHF renal failure altered mental status and left-sided weakness was eventually discharged to rehab and at the rehab patient was found to be confused and was complaining of chest pain.  Patient is a poor historian and is not contributing much to the history most of the history was obtained from the ER physician.  ED Course: After arriving in the ER patient was given nitroglycerin.  Patient was found to be confused CT head did not show anything acute.  Patient was tachypneic and chest x-ray shows congestion with possible pneumonia.  Ammonia levels were negative.  Labs reveal chronic kidney disease and anemia.  Since d-dimer was elevated patient was empirically started on heparin and VQ scan is pending.  Patient also started on antibiotics for possible pneumonia.  Review of Systems: As per HPI, rest all negative.   Past Medical History:  Diagnosis Date  . CHF (congestive heart failure) (HCC)   . Hypertension   . Stroke (HCC)   . Thyroid disease     History reviewed. No pertinent surgical history.   reports that she has never smoked. She has never used smokeless tobacco. She reports that she does not drink alcohol or use drugs.  Allergies  Allergen Reactions  . Penicillins Other (See Comments)    Has patient had a PCN reaction causing immediate rash, facial/tongue/throat swelling, SOB or lightheadedness with hypotension: Yes Has  patient had a PCN reaction causing severe rash involving mucus membranes or skin necrosis: No Has patient had a PCN reaction that required hospitalization: No Has patient had a PCN reaction occurring within the last 10 years: No If all of the above answers are "NO", then may proceed with Cephalosporin use.    Family History  Problem Relation Age of Onset  . Obesity Daughter     Prior to Admission medications   Medication Sig Start Date End Date Taking? Authorizing Provider  acetaminophen (TYLENOL) 500 MG tablet Take 500 mg by mouth every 8 (eight) hours as needed for moderate pain.   Yes [provider]  aspirin EC 81 MG tablet Take 81 mg by mouth daily.   Yes [provider]  atorvastatin (LIPITOR) 20 MG tablet Take 20 mg by mouth at bedtime.   Yes [provider]  carvedilol (COREG) 12.5 MG tablet Take 12.5 mg by mouth 2 (two) times daily.   Yes [provider]  cholecalciferol (VITAMIN D) 1000 units tablet Take 1,000 Units by mouth daily.   Yes [provider]  cinacalcet (SENSIPAR) 30 MG tablet Take 30 mg by mouth 2 (two) times daily.   Yes [provider]  furosemide (LASIX) 40 MG tablet Take 1 tablet (40 mg total) by mouth daily. 04/15/18  Yes Hongalgi, Maximino Greenland, MD  Glycerin-Hypromellose-PEG 400 (ARTIFICIAL TEARS) 0.2-0.2-1 % SOLN Place 1 drop into both eyes every 12 (twelve) hours as needed (for dry eyes).    Yes [provider]  Infant Care Products (DERMACLOUD) CREA Apply  1 application topically 3 (three) times daily. Apply to sacrum and buttocks topically every shift to protect skin from breakdown   Yes [provider]  insulin aspart (NOVOLOG) 100 UNIT/ML injection Inject 0-10 Units into the skin See admin instructions. Inject 0-10 units into the skin three times a day before meals and at bedtime, per sliding scale: BGL 0-150 = 0 unit; 151-200 = 2 units; 201-250 = 4 units; 251-300 = 6 units; 301-350 = 8 units;  351-400 = 10 units; >400 = CALL MD   Yes [provider]  levothyroxine (SYNTHROID, LEVOTHROID) 50 MCG tablet Take 50 mcg by mouth daily before breakfast.   Yes [provider]  loperamide (IMODIUM A-D) 2 MG tablet Take 2 mg by mouth every 6 (six) hours as needed (for diarrhea).    Yes [provider]  nitroGLYCERIN (NITROSTAT) 0.4 MG SL tablet Place 0.4 mg under the tongue every 5 (five) minutes x 3 doses as needed (for chest pain and CALL MD IF NO RELIEF).    Yes [provider]  pantoprazole (PROTONIX) 40 MG tablet Take 40 mg by mouth daily.   Yes [provider]    Physical Exam: Vitals:   04/18/18 0545 04/18/18 0600 04/18/18 0615 04/18/18 0630  BP:    (!) 114/91  Pulse:  69 75   Resp: (!) 22 (!) 25 (!) 24 (!) 21  Temp:      TempSrc:      SpO2:  100% 100%   Weight:      Height:          Constitutional: Moderately built and nourished. Vitals:   04/18/18 0545 04/18/18 0600 04/18/18 0615 04/18/18 0630  BP:    (!) 114/91  Pulse:  69 75   Resp: (!) 22 (!) 25 (!) 24 (!) 21  Temp:      TempSrc:      SpO2:  100% 100%   Weight:      Height:       Eyes: Anicteric no pallor. ENMT: No discharge from the ears eyes nose or mouth. Neck: No mass felt.  No neck rigidity.  No JVD appreciated. Respiratory: No rhonchi or crepitations. Cardiovascular: S1-S2 heard no murmurs appreciated. Abdomen: Soft nontender bowel sounds present. Musculoskeletal: No edema.  No joint effusion. Skin: No rash. Neurologic: Lethargic but follows commands moves all extremities oriented to her name and place. Psychiatric: Lethargic.   Labs on Admission: I have personally reviewed following labs and imaging studies  CBC: Recent Labs  Lab 04/11/18 0724 04/17/18 2130 04/17/18 2141  WBC 8.4  --  6.7  NEUTROABS  --   --  3.9  HGB 10.5* 13.6 11.3*  HCT 35.5* 40.0 37.5  MCV 93.9  --  92.1  PLT 244  --  277   Basic Metabolic Panel: Recent Labs  Lab  04/11/18 0724 04/12/18 0441 04/12/18 2021 04/13/18 0440 04/14/18 0548 04/14/18 1833 04/15/18 0637 04/17/18 2130 04/17/18 2141  NA 136 137  --  137 134*  --  137 131* 138  K 5.0 3.5  --  3.5 4.0  --  3.9 7.9* 4.2  CL 99 99  --  100 96*  --  97* 101 97*  CO2 25 26  --  23 23  --  26  --  27  GLUCOSE 135* 154*  --  162* 128*  --  120* 159* 153*  BUN 36* 33*  --  34* 32*  --  31* 61* 38*  CREATININE 1.50* 1.28*  --  1.28* 1.26*  --  1.24* 1.60* 1.46*  CALCIUM 6.9* 6.9*  --  6.9* 7.1*  --  7.2*  --  7.8*  MG 1.3*  --  1.2*  --   --  1.7  --   --   --    GFR: Estimated Creatinine Clearance: 27.5 mL/min (A) (by C-G formula based on SCr of 1.46 mg/dL (H)). Liver Function Tests: Recent Labs  Lab 04/17/18 2141  AST 20  ALT 9  ALKPHOS 35*  BILITOT 1.0  PROT 7.3  ALBUMIN 3.3*   No results for input(s): LIPASE, AMYLASE in the last 168 hours. Recent Labs  Lab 04/18/18 0121  AMMONIA 33   Coagulation Profile: No results for input(s): INR, PROTIME in the last 168 hours. Cardiac Enzymes: No results for input(s): CKTOTAL, CKMB, CKMBINDEX, TROPONINI in the last 168 hours. BNP (last 3 results) No results for input(s): PROBNP in the last 8760 hours. HbA1C: No results for input(s): HGBA1C in the last 72 hours. CBG: Recent Labs  Lab 04/14/18 1328 04/14/18 1620 04/14/18 2006 04/15/18 0741 04/15/18 1205  GLUCAP 131* 126* 129* 112* 149*   Lipid Profile: No results for input(s): CHOL, HDL, LDLCALC, TRIG, CHOLHDL, LDLDIRECT in the last 72 hours. Thyroid Function Tests: No results for input(s): TSH, T4TOTAL, FREET4, T3FREE, THYROIDAB in the last 72 hours. Anemia Panel: No results for input(s): VITAMINB12, FOLATE, FERRITIN, TIBC, IRON, RETICCTPCT in the last 72 hours. Urine analysis:    Component Value Date/Time   COLORURINE YELLOW 04/18/2018 0304   APPEARANCEUR CLEAR 04/18/2018 0304   LABSPEC 1.011 04/18/2018 0304   PHURINE 5.0 04/18/2018 0304   GLUCOSEU NEGATIVE 04/18/2018  0304   HGBUR NEGATIVE 04/18/2018 0304   BILIRUBINUR NEGATIVE 04/18/2018 0304   KETONESUR NEGATIVE 04/18/2018 0304   PROTEINUR NEGATIVE 04/18/2018 0304   NITRITE NEGATIVE 04/18/2018 0304   LEUKOCYTESUR NEGATIVE 04/18/2018 0304   Sepsis Labs: @LABRCNTIP (procalcitonin:4,lacticidven:4) ) Recent Results (from the past 240 hour(s))  Urine culture     Status: None   Collection Time: 04/08/18  8:29 PM  Result Value Ref Range Status   Specimen Description URINE, RANDOM  Final   Special Requests NONE  Final   Culture   Final    NO GROWTH Performed at [facility], [address], [place], [place] [address]    Report Status 04/10/2018 FINAL  Final  Culture, blood (Routine x 2)     Status: None   Collection Time: 04/08/18  9:15 PM  Result Value Ref Range Status   Specimen Description BLOOD BLOOD RIGHT FOREARM  Final   Special Requests   Final    BOTTLES DRAWN AEROBIC AND ANAEROBIC Blood Culture adequate volume   Culture   Final    NO GROWTH 5 DAYS Performed at [facility], [address], [place], [place] [address]    Report Status 04/13/2018 FINAL  Final  Culture, blood (Routine x 2)     Status: None   Collection Time: 04/08/18 10:20 PM  Result Value Ref Range Status   Specimen Description BLOOD RIGHT ANTECUBITAL  Final   Special Requests   Final    BOTTLES DRAWN AEROBIC AND ANAEROBIC Blood Culture results may not be optimal due to an inadequate volume of blood received in culture bottles   Culture   Final    NO GROWTH 5 DAYS Performed at [facility], [address], [place], [place] [address]    Report Status 04/13/2018  FINAL  Final  MRSA PCR Screening     Status: None   Collection Time: 04/09/18  1:57 AM  Result Value Ref Range Status   MRSA by PCR NEGATIVE NEGATIVE Final    Comment:        The GeneXpert MRSA Assay (FDA approved for NASAL specimens only), is one component of a comprehensive MRSA colonization surveillance program. It is not intended to  diagnose MRSA infection nor to guide or monitor treatment for MRSA infections. Performed at [facility], [address] [place], [place] [address]      Radiological Exams on Admission: Ct Head Wo Contrast  Result Date: 04/18/2018 CLINICAL DATA:  Altered mental status. EXAM: CT HEAD WITHOUT CONTRAST TECHNIQUE: Contiguous axial images were obtained from the base of the skull through the vertex without intravenous contrast. COMPARISON:  Head CT 04/10/2018 and 04/08/2018 FINDINGS: Brain: Unchanged left basal ganglia/subinsular subacute or chronic lacunar infarct from prior exam. No hemorrhagic transformation. No evidence of new ischemia or hemorrhage elsewhere. No subdural or extra-axial fluid collection. Unchanged degree of atrophy and chronic small vessel ischemia. No hydrocephalus. Vascular: No hyperdense vessel. Atherosclerosis of skull base vasculature. Skull: No focal lesion or fracture. Diffuse calvarial heterogeneity likely osteopenia/osteoporosis. Sinuses/Orbits: Prior cataract resection.  No acute findings. Other: None. IMPRESSION: 1.  No acute intracranial abnormality. 2. Unchanged subacute or chronic small left basal ganglia/subinsular lacunar infarct since prior exam. Unchanged atrophy and chronic small vessel ischemia. Electronically Signed   By: Narda Rutherford M.D.   On: 04/18/2018 05:18   Dg Chest Port 1 View  Result Date: 04/17/2018 CLINICAL DATA:  Altered mental status, intermittent chest pain, history CHF, hypertension, stroke EXAM: PORTABLE CHEST 1 VIEW COMPARISON:  Portable exam 2007 hours compared to 04/11/2018 FINDINGS: LEFT subclavian sequential pacemaker with leads projecting over enlarged RIGHT atrium and RIGHT ventricle. Enlargement of cardiac silhouette. Mediastinal contours and pulmonary vascularity normal. Atherosclerotic calcification aorta. Persistent atelectasis versus consolidation of LEFT lower lobe. Improved aeration at RIGHT base since previous exam, with  minimal residual RIGHT pleural effusion and basilar atelectasis. No pneumothorax. 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 IMPRESSION: Enlargement of cardiac silhouette post pacemaker. Persistent atelectasis versus consolidation LEFT lower lobe. Improved aeration RIGHT base. Electronically Signed   By: Ulyses Southward M.D.   On: 04/17/2018 20:28    EKG: Independently reviewed.  Patient rhythm.  Assessment/Plan Principal Problem:   Acute encephalopathy Active Problems:   Acute on chronic systolic CHF (congestive heart failure) (HCC)   Hypothyroidism   Insulin-requiring or dependent type II diabetes mellitus (HCC)   History of CVA with residual deficit   CKD (chronic kidney disease) stage 3, GFR 30-59 ml/min (HCC)    1. Acute encephalopathy -during recent admission for many of patient's medications which potentially could cause confusion was held.  CT head is unremarkable at this time VBG does not show any carbon dioxide retention.  Closely observe.  We will get swallow evaluation. 2. Acute respiratory failure likely from possible pneumonia/aspiration possible CHF and will have to rule out PE.  Patient empirically started on heparin until we can get VQ scan.  If PE is negative will need IV Lasix.  Patient's last EF measured was 20 to 25% last week. 3. Mildly elevated troponin we will cycle cardiac markers.  Patient does not appear to be in active chest pain though. 4. Hypothyroidism presently dosed IV Synthroid until patient can reliably swallow orally. 5.  Diabetes mellitus type 2 on sliding scale coverage for now. 6. Chronic kidney disease stage III creatinine around 1.4 now. 7. History of CVA with left-sided weakness.  Unable to do MRI due to pacemaker. 8. History of pacemaker placement.   DVT prophylaxis: Lovenox. Code Status: Full code. Family Communication: No family at the  bedside. Disposition Plan: Skilled nursing facility. Consults called: None. Admission status: Observation.   Eduard Clos MD [facility] Pager [phone].  If 7PM-7AM, please contact night-coverage [email or web address] Password [facility]  04/18/2018, 6:43 AM

## 2018-04-18 NOTE — Progress Notes (Signed)
Patient was able to communicate with simple answers, she was able to open her eyes all the way, and tolerated drinking some pain medication.  I will keep monitoring patient.

## 2018-04-18 NOTE — ED Notes (Signed)
Admitting MD textpaged.   Tina Waters  Room F05  Troponin0.12  LAc  2.5 Jesse Sans RN

## 2018-04-18 NOTE — ED Notes (Signed)
Lab results given to Dr. Preston Fleeting

## 2018-04-18 NOTE — Progress Notes (Signed)
SLP Cancellation Note  Patient Details Name: Tina Waters MRN: [id number] DOB: 07-04-1928   Cancelled treatment:       Reason Eval/Treat Not Completed: Patient not medically ready. Pt currently leaving unit for testing. Critical labs and pending transfer to stepdown. ST will continue efforts to complete BSE when appropriate.  Celia B. Murvin Natal [facility], CCC-SLP Speech Language Pathologist [phone]  Leigh Aurora 04/18/2018, 12:26 PM

## 2018-04-18 NOTE — ED Notes (Signed)
Dr. Preston Fleeting performed femoral stick to retrieve blood, nurse at bedside with provider

## 2018-04-18 NOTE — Progress Notes (Signed)
Pt received to room 3e10 from ED, pt alert and says she is at [facility], confirmed Heparin at 1200 uhr via right ac,while putting on telemetry on  Noted to take breath then would not breath for several seconds and required sternal rub, pt would awaken but drowsy and falls back to sleep, pt cool to touch but dry, very weak pulses,  charge nurse and Public affairs consultant in to assess, Dr Jomarie Longs at bedside to evaluate,  ns via left am iv, unable to infuse vancomycin as iv infiltrated in left ac, rapid nurse attempt iv but unsuccesful, Dr Jomarie Longs placed transfer order to stepdown, pt to vascular u/s with Cristin RN  Charge nurse.

## 2018-04-18 NOTE — Progress Notes (Signed)
Pt seen and examined, admitted earlier this am by Dr.Kakrakandy This is a chronically ill elderly female from SNF with history of chronic systolic CHF, EF of 20%, history of permanent pacemaker, history of CVA with left-sided weakness, diabetes mellitus, chronic kidney was just discharged from [facility] 3 days ago, after an admission for encephalopathy and worsening left-sided weakness, CT head showed subacute left basal ganglia infarct, MRI could not be done due to pacer, she was also felt to have metabolic  Encephalopathy from polypharmacy and multiple sedating medications were discontinued done. -Now patient is mostly bedbound with left-sided weakness, reportedly sent from nursing home due to periods of lethargy and some chest pain. -In the emergency room patient was found te findings but showed subacute versus chronic stroke findings unchanged from recent hospitalization, chest x-ray showed pulmonary vascular congestion and possible pneumonia which is improved from prior hospitalization, she also had a VQ scan done due to significantly elevated d-dimer. -Started on IV heparin and empiric antibiotics for hospital-acquired pneumonia  1. Encephalopathy/ Lethargy -Appears to have improved, patient is easily aroused, CT head without any acute findings, CBGs stable -no evidence of overt infection at this time, chest x-ray is improved from previous hospitalization -Pro-calcitonin is less than 0.1, no fever or leukocytosis -lactate is mildly elevated due to poor tissue perfusion from severe cardiomyopathy -Urinalysis is unremarkable -Check EEG -Stop antibiotics and monitor  2. Intermediate probability of pulmonary embolism -Check venous Dopplers -Called and discussed with daughter Hart Rochester, due to high risk for DVTs from age, chronic illnesses and immobility, with an intermediate probability VQ scan will treat as acute pulmonary embolism -Continue IV heparin for now pending swallow eval -Can  be transitioned to low-dose Apixaban based on GFR and age, after swallowing is assessed  3. history of CVA with left-sided  Weakness -Unable to have MRI done, CT with subacute infarct unchanged  From recent hospitalization -On full dose heparin now  4. Hypothyroidism -per chart review, her TSH was 12.5 last week, Synthroid dose was 50 g however SNF was unable to verify her prior Synthroid dose, hence she was left on the same dosage last week, will increase Synthroid to 75 g PO daily, so equivalent IV dose would be 37.5 micrograms -Needs repeat TSH in 6 weeks  5. Diabetes mellitus type 2  6. Severe cardiomyopathy/chronic systolic CHF -EF of 20-25% -Followed by Dr.Harwani -resume Coreg -minimally elevated troponin, flat trend, no evidence of ACS clinically -Seen by cardiology last week and not felt to be a candidate for aggressive ischemia evaluation  Ethics: -This is a chronically ill 84 year old female from SNF with severe cardiomyopathy EF 20%, CVA w/left-sided weakness, chronic kidney disease, bedbound at baseline -Called daughter Hollis/PoA and discussed the high risk of decompensation from her multitude of medical problems, recommended DNR-she is agreeable, if she clinically deteriorates further I recommended Palliative consult for Goals of Care.  Zannie Cove, MD

## 2018-04-18 NOTE — Progress Notes (Signed)
Report called to [facility] on 6E, all questions entertained and answered, pt taken to 6e21 via bed, kelley at bedside

## 2018-04-18 NOTE — ED Provider Notes (Signed)
00:15 AM Care assumed from Dr. Anitra Lauth, patient presented with altered mental status, currently pending ammonia level, venous blood gas, d-dimer.  Will need to be admitted after those results have come back.  1:22 AM Due to inability to obtain blood through phlebotomy, I have drawn blood via left femoral venipuncture.  3:45 AM Lactic acid level has come back slightly elevated.  VBG is unremarkable.  Troponin is decreasing.  D-dimer is markedly elevated.  She has significant renal insufficiency, so cannot get CT angiogram.  Will need to send for lung scan, started on heparin.  Case is discussed with Dr. Toniann Fail of [facility], who agrees to admit the patient. He requests CT of head be obtained.   Dione Booze, MD 04/18/18 [phone]

## 2018-04-19 ENCOUNTER — Ambulatory Visit (HOSPITAL_COMMUNITY): Payer: Medicare Other

## 2018-04-19 DIAGNOSIS — R7989 Other specified abnormal findings of blood chemistry: Secondary | ICD-10-CM | POA: Diagnosis present

## 2018-04-19 DIAGNOSIS — I2609 Other pulmonary embolism with acute cor pulmonale: Secondary | ICD-10-CM | POA: Diagnosis not present

## 2018-04-19 DIAGNOSIS — E1122 Type 2 diabetes mellitus with diabetic chronic kidney disease: Secondary | ICD-10-CM | POA: Diagnosis present

## 2018-04-19 DIAGNOSIS — I2699 Other pulmonary embolism without acute cor pulmonale: Secondary | ICD-10-CM

## 2018-04-19 DIAGNOSIS — R402250 Coma scale, best verbal response, oriented, unspecified time: Secondary | ICD-10-CM | POA: Diagnosis present

## 2018-04-19 DIAGNOSIS — I361 Nonrheumatic tricuspid (valve) insufficiency: Secondary | ICD-10-CM

## 2018-04-19 DIAGNOSIS — Z66 Do not resuscitate: Secondary | ICD-10-CM | POA: Diagnosis present

## 2018-04-19 DIAGNOSIS — I6381 Other cerebral infarction due to occlusion or stenosis of small artery: Secondary | ICD-10-CM | POA: Diagnosis present

## 2018-04-19 DIAGNOSIS — J9811 Atelectasis: Secondary | ICD-10-CM | POA: Diagnosis present

## 2018-04-19 DIAGNOSIS — R740 Nonspecific elevation of levels of transaminase and lactic acid dehydrogenase [LDH]: Secondary | ICD-10-CM | POA: Diagnosis present

## 2018-04-19 DIAGNOSIS — R402360 Coma scale, best motor response, obeys commands, unspecified time: Secondary | ICD-10-CM | POA: Diagnosis present

## 2018-04-19 DIAGNOSIS — I824Y2 Acute embolism and thrombosis of unspecified deep veins of left proximal lower extremity: Secondary | ICD-10-CM | POA: Diagnosis not present

## 2018-04-19 DIAGNOSIS — I824Y9 Acute embolism and thrombosis of unspecified deep veins of unspecified proximal lower extremity: Secondary | ICD-10-CM

## 2018-04-19 DIAGNOSIS — J9600 Acute respiratory failure, unspecified whether with hypoxia or hypercapnia: Secondary | ICD-10-CM | POA: Diagnosis present

## 2018-04-19 DIAGNOSIS — I6930 Unspecified sequelae of cerebral infarction: Secondary | ICD-10-CM

## 2018-04-19 DIAGNOSIS — N183 Chronic kidney disease, stage 3 (moderate): Secondary | ICD-10-CM

## 2018-04-19 DIAGNOSIS — R402140 Coma scale, eyes open, spontaneous, unspecified time: Secondary | ICD-10-CM | POA: Diagnosis present

## 2018-04-19 DIAGNOSIS — G9340 Encephalopathy, unspecified: Secondary | ICD-10-CM | POA: Diagnosis not present

## 2018-04-19 DIAGNOSIS — R748 Abnormal levels of other serum enzymes: Secondary | ICD-10-CM

## 2018-04-19 DIAGNOSIS — F0390 Unspecified dementia without behavioral disturbance: Secondary | ICD-10-CM | POA: Diagnosis present

## 2018-04-19 DIAGNOSIS — G9341 Metabolic encephalopathy: Secondary | ICD-10-CM | POA: Diagnosis present

## 2018-04-19 DIAGNOSIS — I272 Pulmonary hypertension, unspecified: Secondary | ICD-10-CM | POA: Diagnosis present

## 2018-04-19 DIAGNOSIS — R791 Abnormal coagulation profile: Secondary | ICD-10-CM | POA: Diagnosis present

## 2018-04-19 DIAGNOSIS — I130 Hypertensive heart and chronic kidney disease with heart failure and stage 1 through stage 4 chronic kidney disease, or unspecified chronic kidney disease: Secondary | ICD-10-CM | POA: Diagnosis present

## 2018-04-19 DIAGNOSIS — I69354 Hemiplegia and hemiparesis following cerebral infarction affecting left non-dominant side: Secondary | ICD-10-CM | POA: Diagnosis not present

## 2018-04-19 DIAGNOSIS — E039 Hypothyroidism, unspecified: Secondary | ICD-10-CM | POA: Diagnosis present

## 2018-04-19 DIAGNOSIS — I5023 Acute on chronic systolic (congestive) heart failure: Secondary | ICD-10-CM | POA: Diagnosis present

## 2018-04-19 DIAGNOSIS — Z7982 Long term (current) use of aspirin: Secondary | ICD-10-CM | POA: Diagnosis not present

## 2018-04-19 DIAGNOSIS — I82412 Acute embolism and thrombosis of left femoral vein: Secondary | ICD-10-CM | POA: Diagnosis present

## 2018-04-19 DIAGNOSIS — D631 Anemia in chronic kidney disease: Secondary | ICD-10-CM | POA: Diagnosis present

## 2018-04-19 DIAGNOSIS — I4510 Unspecified right bundle-branch block: Secondary | ICD-10-CM | POA: Diagnosis present

## 2018-04-19 LAB — ECHOCARDIOGRAM COMPLETE
Height: 62 in
Weight: 2908.8 oz

## 2018-04-19 LAB — GLUCOSE, CAPILLARY
GLUCOSE-CAPILLARY: 113 mg/dL — AB (ref 70–99)
GLUCOSE-CAPILLARY: 121 mg/dL — AB (ref 70–99)
GLUCOSE-CAPILLARY: 135 mg/dL — AB (ref 70–99)
GLUCOSE-CAPILLARY: 157 mg/dL — AB (ref 70–99)
Glucose-Capillary: 163 mg/dL — ABNORMAL HIGH (ref 70–99)
Glucose-Capillary: 157 mg/dL — ABNORMAL HIGH (ref 70–99)

## 2018-04-19 LAB — CBC
HEMATOCRIT: 31.8 % — AB (ref 36.0–46.0)
HEMOGLOBIN: 9.7 g/dL — AB (ref 12.0–15.0)
MCH: 28 pg (ref 26.0–34.0)
MCHC: 30.5 g/dL (ref 30.0–36.0)
MCV: 91.6 fL (ref 78.0–100.0)
Platelets: 243 10*3/uL (ref 150–400)
RBC: 3.47 MIL/uL — AB (ref 3.87–5.11)
RDW: 15.9 % — ABNORMAL HIGH (ref 11.5–15.5)
WBC: 7.8 10*3/uL (ref 4.0–10.5)

## 2018-04-19 LAB — BASIC METABOLIC PANEL
Anion gap: 12 (ref 5–15)
BUN: 41 mg/dL — ABNORMAL HIGH (ref 8–23)
CO2: 27 mmol/L (ref 22–32)
CREATININE: 1.41 mg/dL — AB (ref 0.44–1.00)
Calcium: 7.4 mg/dL — ABNORMAL LOW (ref 8.9–10.3)
Chloride: 100 mmol/L (ref 98–111)
GFR calc non Af Amer: 32 mL/min — ABNORMAL LOW (ref >60)
GFR, EST AFRICAN AMERICAN: 38 mL/min — AB (ref >60)
Glucose, Bld: 119 mg/dL — ABNORMAL HIGH (ref 70–99)
POTASSIUM: 3.5 mmol/L (ref 3.5–5.1)
SODIUM: 139 mmol/L (ref 135–145)

## 2018-04-19 LAB — HEPARIN LEVEL (UNFRACTIONATED): Heparin Unfractionated: 1.24 IU/mL — ABNORMAL HIGH (ref 0.30–0.70)

## 2018-04-19 MED ORDER — HEPARIN (PORCINE) IN NACL 100-0.45 UNIT/ML-% IJ SOLN
500.0000 [IU]/h | INTRAMUSCULAR | Status: DC
  Administered 2018-04-19: 500 [IU]/h via INTRAVENOUS

## 2018-04-19 MED ORDER — INSULIN ASPART 100 UNIT/ML ~~LOC~~ SOLN
0.0000 [IU] | Freq: Three times a day (TID) | SUBCUTANEOUS | Status: DC
  Administered 2018-04-20 (×2): 1 [IU] via SUBCUTANEOUS

## 2018-04-19 MED ORDER — HEPARIN (PORCINE) IN NACL 100-0.45 UNIT/ML-% IJ SOLN: 700.0000 [IU]/h | INTRAMUSCULAR | Status: DC

## 2018-04-19 MED ORDER — PERFLUTREN LIPID MICROSPHERE
1.0000 mL | INTRAVENOUS | Status: AC | PRN
  Filled 2018-04-19: qty 10

## 2018-04-19 MED ORDER — LEVOTHYROXINE SODIUM 50 MCG PO TABS
50.0000 ug | ORAL_TABLET | Freq: Every day | ORAL | Status: DC
  Administered 2018-04-20: 50 ug via ORAL
  Filled 2018-04-19: qty 1

## 2018-04-19 NOTE — Progress Notes (Signed)
PROGRESS NOTE [facility]   Maralene Manfra   ENI:[id number] DOB: 1928-03-10  DOA: 04/17/2018 PCP: Eloisa Northern, MD   Brief Narrative:  Tina Waters is an 84 yo female who was recently admitted and discharged from the hospital several days ago for altered mental status, increasing left-sided weakness, CHF, renal failure, and suspected polypharmacy-induced encephalopathy. Yesterday morning she returned after being found confused and complaining of chest pain. Administered nitroglycerin in the ED. Patient was tachypneic and elevated d-dimer so started on heparin and VQ scan ordered. Ammonia levels negative. CXR demonstrated congestion. CT head demonstrated a subacute or chronic basal ganglia lacunar infarct.    Subjective: Patient was limited with her ability to communicate, but was able to answer questions appropriately. She was unaware of current month or year, but was able to correctly identify her daughter and list the names of her other children. She denies any chest pain, nausea, SOB, or difficulty breathing. Admits to continued left-sided weakness.   Assessment & Plan:   Principal Problem:   Acute encephalopathy Active Problems:   Acute on chronic systolic CHF (congestive heart failure) (HCC)   Hypothyroidism   Insulin-requiring or dependent type II diabetes mellitus (HCC)   History of CVA with residual deficit   CKD (chronic kidney disease) stage 3, GFR 30-59 ml/min (HCC)  Encephalopathy Patient appears to be improving, but with intermittent delirium. No evidence of infection and patient able to be aroused. - Continue to monitor  Acute pulmonary embolism The intermediate probability of PE from V/Q scan combined with the elevated troponin and acute left leg DVT found on [place] support the diagnosis of an acute PE. - Continue IV heparin  Acute on chronic systolic CHF - Ordered echocardiogram  Hx of CVA w/ residual deficit Particularly increasing left-sided weakness. - Ordered  PT evaluation  Hypothyroidism Elevated TSH. - Levothyroxine 50 mcg PO  CKD stage III Creatinine 1.41, BUN 41, GFR 32.  DVT prophylaxis: SCDs Code Status: DNR Family Communication: Daughter at bedside for discussion Disposition Plan: Will observe overnight with plan to discharge tomorrow   Consultants:   None  Procedures:   None  Antimicrobials:  None   Objective: Vitals:   04/19/18 0300 04/19/18 0642 04/19/18 0700 04/19/18 1140  BP: 113/66  105/71 100/68  Pulse: 69 70 72 70  Resp: 16     Temp: (!) 97.1 F (36.2 C)  97.8 F (36.6 C) 97.6 F (36.4 C)  TempSrc: Rectal  Rectal Oral  SpO2: 95%  97% 98%  Weight: 82.5 kg     Height:        Intake/Output Summary (Last 24 hours) at 04/19/2018 1455 Last data filed at 04/19/2018 0900 Gross per 24 hour  Intake 261.55 ml  Output -  Net 261.55 ml   Filed Weights   04/18/18 0345 04/19/18 0300  Weight: 85.2 kg 82.5 kg    Examination:  General exam: Appears calm and comfortable  Respiratory system: Clear to auscultation. No wheezes,crackle or rhonchi Cardiovascular system: S1 & S2 heard, irregular rate corrected w/ pacemaker. Blowing, systolic murmur 2/6 LUSB. Gastrointestinal system: Abdomen is nondistended, soft and nontender. No organomegaly or masses felt. Normal bowel sounds heard. Central nervous system: Slight delusional thinking and dementia. Left sided weakness. Extremities: Slight edema. Left sided weakness. Wheelchair bound. Skin: No rashes, lesions or ulcers Psychiatry: Judgment and insight appear normal. Mood & affect appropriate.    Data Reviewed: I have personally reviewed following labs and imaging studies  CBC: Recent Labs  Lab 04/17/18 2130  04/17/18 2141 04/18/18 0851 04/19/18 0318  WBC  --  6.7 7.5 7.8  NEUTROABS  --  3.9 4.4  --   HGB 13.6 11.3* 11.2* 9.7*  HCT 40.0 37.5 38.3 31.8*  MCV  --  92.1 94.6 91.6  PLT  --  277 256 243   Basic Metabolic Panel: Recent Labs  Lab  04/12/18 2021  04/14/18 0548 04/14/18 1833 04/15/18 0637 04/17/18 2130 04/17/18 2141 04/18/18 0851 04/19/18 0318  NA  --    < > 134*  --  137 131* 138 140 139  K  --    < > 4.0  --  3.9 7.9* 4.2 3.7 3.5  CL  --    < > 96*  --  97* 101 97* 100 100  CO2  --    < > 23  --  26  --  27 25 27   GLUCOSE  --    < > 128*  --  120* 159* 153* 152* 119*  BUN  --    < > 32*  --  31* 61* 38* 39* 41*  CREATININE  --    < > 1.26*  --  1.24* 1.60* 1.46* 1.46* 1.41*  CALCIUM  --    < > 7.1*  --  7.2*  --  7.8* 7.7* 7.4*  MG 1.2*  --   --  1.7  --   --   --   --   --    < > = values in this interval not displayed.   GFR: Estimated Creatinine Clearance: 28 mL/min (A) (by C-G formula based on SCr of 1.41 mg/dL (H)). Liver Function Tests: Recent Labs  Lab 04/17/18 2141 04/18/18 0851  AST 20 19  ALT 9 10  ALKPHOS 35* 33*  BILITOT 1.0 0.9  PROT 7.3 7.1  ALBUMIN 3.3* 3.3*   No results for input(s): LIPASE, AMYLASE in the last 168 hours. Recent Labs  Lab 04/18/18 0121  AMMONIA 33   Coagulation Profile: No results for input(s): INR, PROTIME in the last 168 hours. Cardiac Enzymes: Recent Labs  Lab 04/18/18 0851  TROPONINI 0.12*   BNP (last 3 results) No results for input(s): PROBNP in the last 8760 hours. HbA1C: No results for input(s): HGBA1C in the last 72 hours. CBG: Recent Labs  Lab 04/18/18 2041 04/19/18 0043 04/19/18 0447 04/19/18 0729 04/19/18 1139  GLUCAP 134* 113* 121* 135* 157*   Lipid Profile: No results for input(s): CHOL, HDL, LDLCALC, TRIG, CHOLHDL, LDLDIRECT in the last 72 hours. Thyroid Function Tests: Recent Labs    04/18/18 0851  TSH 11.074*   Anemia Panel: No results for input(s): VITAMINB12, FOLATE, FERRITIN, TIBC, IRON, RETICCTPCT in the last 72 hours. Sepsis Labs: Recent Labs  Lab 04/18/18 0133 04/18/18 0608 04/18/18 0851 04/18/18 0903  PROCALCITON  --   --  <0.10  --   LATICACIDVEN 2.61* 2.6* 2.5* 2.58*    No results found for this or any  previous visit (from the past 240 hour(s)).    Radiology Studies: Ct Head Wo Contrast  Result Date: 04/18/2018 CLINICAL DATA:  Altered mental status. EXAM: CT HEAD WITHOUT CONTRAST TECHNIQUE: Contiguous axial images were obtained from the base of the skull through the vertex without intravenous contrast. COMPARISON:  Head CT 04/10/2018 and 04/08/2018 FINDINGS: Brain: Unchanged left basal ganglia/subinsular subacute or chronic lacunar infarct from prior exam. No hemorrhagic transformation. No evidence of new ischemia or hemorrhage elsewhere. No subdural or extra-axial fluid collection. Unchanged degree of atrophy and  chronic small vessel ischemia. No hydrocephalus. Vascular: No hyperdense vessel. Atherosclerosis of skull base vasculature. Skull: No focal lesion or fracture. Diffuse calvarial heterogeneity likely osteopenia/osteoporosis. Sinuses/Orbits: Prior cataract resection.  No acute findings. Other: None. IMPRESSION: 1.  No acute intracranial abnormality. 2. Unchanged subacute or chronic small left basal ganglia/subinsular lacunar infarct since prior exam. Unchanged atrophy and chronic small vessel ischemia. Electronically Signed   By: Narda Rutherford M.D.   On: 04/18/2018 05:18   Nm Pulmonary Vent And Perf (v/q Scan)  Result Date: 04/18/2018 CLINICAL DATA:  84 year old presenting with acute chest pain and elevated D-dimer. Stage 3 chronic kidney disease which precludes IV contrast for CTA chest. EXAM: NUCLEAR MEDICINE VENTILATION - PERFUSION LUNG SCAN TECHNIQUE: Ventilation images were obtained in multiple projections using inhaled aerosol Tc-87m DTPA. Perfusion images were obtained in multiple projections after intravenous injection of Tc-37m-MAA. RADIOPHARMACEUTICALS:  31 mCi of Tc-63m DTPA aerosol inhalation and 4.25 mCi Tc23m-MAA IV. COMPARISON:  No prior nuclear imaging. Portable chest x-ray yesterday is correlated. FINDINGS: Ventilation: Large multi-segment ventilatory defects involving the  LEFT UPPER LOBE and LEFT LOWER LOBE. Segmental ventilatory defect involving the POSTERIOR RIGHT UPPER LOBE. Perfusion: Matched perfusion defects in both lungs. No mismatched perfusion defects. IMPRESSION: Intermediate probability of pulmonary embolism based on PIOPED II criteria. Electronically Signed   By: Hulan Saas M.D.   On: 04/18/2018 11:25   Dg Chest Port 1 View  Result Date: 04/17/2018 CLINICAL DATA:  Altered mental status, intermittent chest pain, history CHF, hypertension, stroke EXAM: PORTABLE CHEST 1 VIEW COMPARISON:  Portable exam 2007 hours compared to 04/11/2018 FINDINGS: LEFT subclavian sequential pacemaker with leads projecting over enlarged RIGHT atrium and RIGHT ventricle. Enlargement of cardiac silhouette. Mediastinal contours and pulmonary vascularity normal. Atherosclerotic calcification aorta. Persistent atelectasis versus consolidation of LEFT lower lobe. Improved aeration at RIGHT base since previous exam, with minimal residual RIGHT pleural effusion and basilar atelectasis. No pneumothorax. 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 IMPRESSION: Enlargement of cardiac silhouette post pacemaker. Persistent atelectasis versus consolidation LEFT lower lobe. Improved aeration RIGHT base. Electronically Signed   By: Ulyses Southward M.D.   On: 04/17/2018 20:28      Scheduled Meds: . carvedilol  12.5 mg Oral BID WC  . insulin aspart  0-9 Units Subcutaneous Q4H  . [START ON 04/20/2018] levothyroxine  50 mcg Oral QAC breakfast   Continuous Infusions: . heparin 700 Units/hr (04/19/18 0650)     LOS: 0 days    Time spent: Total of 30 minutes spent with pt, greater than 50% of which was spent in discussion of  treatment, counseling and coordination of care    Evelena Leyden, PA-S Pager: Text Page via [email or web address]   If 7PM-7AM, please contact  night-coverage [email or web address] 04/19/2018, 2:55 PM   Note - This record has been created using [employer]. Chart creation errors have been sought, but may not always have been located. Such creation errors do not reflect on the standard of medical care.

## 2018-04-19 NOTE — Progress Notes (Signed)
Chaplain prayed with the PT at the request of her family members.

## 2018-04-19 NOTE — Progress Notes (Signed)
*  PRELIMINARY RESULTS* Echocardiogram 2D Echocardiogram has been performed.  Tina Waters 04/19/2018, 2:39 PM

## 2018-04-19 NOTE — NC FL2 (Signed)
[place] MEDICAID FL2 LEVEL OF CARE SCREENING TOOL     IDENTIFICATION  Patient Name: Tina Waters Birthdate: Aug 12, 1927 Sex: female Admission Date (Current Location): 04/17/2018  [facility] and [place] Number:  Producer, television/film/video and Address:  [facility] [place], [address] [place], [place] [address]      Provider Number: [id number]  Attending Physician Name and Address:  Coralie Keens,*  Relative Name and Phone Number:  Lauro Regulus, daughter, [phone]    Current Level of Care: Hospital Recommended Level of Care: Skilled Nursing Facility Prior Approval Number:    Date Approved/Denied:   PASRR Number: [id number]  Discharge Plan: SNF    Current Diagnoses: Patient Active Problem List   Diagnosis Date Noted  . CKD (chronic kidney disease) stage 3, GFR 30-59 ml/min (HCC) 04/18/2018  . Transient speech disturbance 04/09/2018  . Acute on chronic systolic CHF (congestive heart failure) (HCC) 04/08/2018  . Hypothyroidism 04/08/2018  . Insulin-requiring or dependent type II diabetes mellitus (HCC) 04/08/2018  . AKI (acute kidney injury) (HCC) 04/08/2018  . Acute encephalopathy 04/08/2018  . History of CVA with residual deficit 04/08/2018  . Elevated troponin 04/08/2018  . Hyperkalemia 04/08/2018  . Elevated lactic acid level 04/08/2018    Orientation RESPIRATION BLADDER Height & Weight     Self  Normal Incontinent Weight: 181 lb 12.8 oz (82.5 kg) Height:  5\' 2"  (157.5 cm)  BEHAVIORAL SYMPTOMS/MOOD NEUROLOGICAL BOWEL NUTRITION STATUS      Continent Diet(dysphagia 3; please see DC summary)  AMBULATORY STATUS COMMUNICATION OF NEEDS Skin   (baseline) Verbally Normal                       Personal Care Assistance Level of Assistance  Bathing, Feeding, Dressing(baseline)           Functional Limitations Info  Sight, Hearing, Speech Sight Info: Adequate Hearing Info: Adequate Speech Info: Adequate    SPECIAL CARE FACTORS  FREQUENCY                       Contractures Contractures Info: Not present    Additional Factors Info  Code Status, Allergies, Insulin Sliding Scale Code Status Info: DNR Allergies Info: Penicillins   Insulin Sliding Scale Info: novolog Q4 hrs       Current Medications (04/19/2018):  This is the current hospital active medication list Current Facility-Administered Medications  Medication Dose Route Frequency Provider Last Rate Last Dose  . acetaminophen (TYLENOL) tablet 650 mg  650 mg Oral Q6H PRN Eduard Clos, MD   650 mg at 04/18/18 2236   Or  . acetaminophen (TYLENOL) suppository 650 mg  650 mg Rectal Q6H PRN Eduard Clos, MD      . carvedilol (COREG) tablet 12.5 mg  12.5 mg Oral BID WC Zannie Cove, MD   12.5 mg at 04/19/18 0642  . heparin ADULT infusion 100 units/mL (25000 units/278mL sodium chloride 0.45%)  700 Units/hr Intravenous Continuous Zannie Cove, MD 7 mL/hr at 04/19/18 0650 700 Units/hr at 04/19/18 0650  . insulin aspart (novoLOG) injection 0-9 Units  0-9 Units Subcutaneous Q4H Eduard Clos, MD   1 Units at 04/19/18 0859  . levothyroxine (SYNTHROID, LEVOTHROID) injection 25 mcg  25 mcg Intravenous Daily Eduard Clos, MD   25 mcg at 04/19/18 0856  . ondansetron (ZOFRAN) tablet 4 mg  4 mg Oral Q6H PRN Eduard Clos, MD       Or  .  ondansetron (ZOFRAN) injection 4 mg  4 mg Intravenous Q6H PRN Eduard Clos, MD      . sodium chloride flush (NS) 0.9 % injection 10-40 mL  10-40 mL Intracatheter PRN Zannie Cove, MD         Discharge Medications: Please see discharge summary for a list of discharge medications.  Relevant Imaging Results:  Relevant Lab Results:   Additional Information SSN: [id number]  Abigail Butts, LCSW

## 2018-04-19 NOTE — Progress Notes (Addendum)
PROGRESS NOTE    Tina Waters  ZOX:[id number] DOB: [birth year] DOA: 04/17/2018 PCP: Eloisa Northern, MD    Brief Narrative:  84 year old female who presented with chest pain and altered mentation.  She does have the significant past medical history for systolic heart failure, CVA with left-sided hemiparesis, type 2 diabetes mellitus, chronic kidney disease, hypothyroidism and chronic anemia.  Patient was discharged March 15, 2018 from the hospital after being treated for toxic encephalopathy and decompensated heart failure.  Patient is wheelchair-bound, bedridden due to left sided hemiparesis for approximately 9 months.  Unable to get detailed information due to her baseline dementia, but per her daughter she was not being herself for the last 24 hours, with worsening confusion and disorientation.  Her initial physical examination blood pressure 114/91, heart rate 75, respiratory rate 25, oxygen saturation 100%.  Moist mucous membranes, lungs clear to auscultation bilaterally, heart S1-S2 present and rhythmic, abdomen protuberant, no lower extremity edema, left-sided hemiparesis.  Patient was lethargic but able to follow commands.  Patient was admitted to the hospital with the working diagnosis of acute chest pain complicated by metabolic encephalopathy.   Assessment & Plan:   Principal Problem:   Acute encephalopathy Active Problems:   Acute on chronic systolic CHF (congestive heart failure) (HCC)   Hypothyroidism   Insulin-requiring or dependent type II diabetes mellitus (HCC)   History of CVA with residual deficit   CKD (chronic kidney disease) stage 3, GFR 30-59 ml/min (HCC)   1.  Acute pulmonary embolism with acute left lower extremity deep vein thrombosis. Will continue anticoagulation with IV heparin, follow on echocardiogram and telemetry monitoring. Patient with elevated troponin consistent with poor prognosis. Close follow up of GFR, if stable above 30 will start patient on apixaban. VTE  seems to be provoked due to CVA and lack of mobility in her case likely she will need life long anticoagulation.   2.  Metabolic encephalopathy. Patient continue to altered, compared to her baseline (per her daughter at the bedside), positive features of delirium. Continue supportive medical therapy, adequate nutrition and pain control.   3.  Type 2 diabetes mellitus. This am fasting glucose 119, will continue sliding scale for glucose cover and monitoring.   4.  Stage III chronic kidney disease. Renal function with serum cr at 1,4 with K at 3,5 and serum bicarbonate at 27. Will continue to follow on renal panel in am.   5.  CVA with left-sided hemiparesis. Patient wheelchair bound, will continue physical therapy and nutrition evaluation.  6. HTN. Continue carvedilol, follow on echocardiography.   7. Hypothyroid. Continue levothyroxine.   DVT prophylaxis:heparin   Code Status:  DNR  Family Communication: I spoke with patient's daugher at the bedside and all questions were addressed.  Disposition Plan/ discharge barriers: SNF when stable    Consultants:     Procedures:     Antimicrobials:       Subjective: Patient continue to be confused, not at her baseline, per her daughter who is at the bedside, no apparent pain or dyspnea.   Objective: Vitals:   04/19/18 0300 04/19/18 0642 04/19/18 0700 04/19/18 1140  BP: 113/66  105/71 100/68  Pulse: 69 70 72 70  Resp: 16     Temp: (!) 97.1 F (36.2 C)  97.8 F (36.6 C) 97.6 F (36.4 C)  TempSrc: Rectal  Rectal Oral  SpO2: 95%  97% 98%  Weight: 82.5 kg     Height:        Intake/Output Summary (Last  24 hours) at 04/19/2018 1555 Last data filed at 04/19/2018 0900 Gross per 24 hour  Intake 261.55 ml  Output -  Net 261.55 ml   Filed Weights   04/18/18 0345 04/19/18 0300  Weight: 85.2 kg 82.5 kg    Examination:   General: deconditioned and ill looking appearing  Neurology: Awake, orientated to place and person, not  time, left sided hemiparesis.  E ENT: mild pallor, no icterus, oral mucosa moist Cardiovascular: No JVD. S1-S2 present, rhythmic, no gallops, rubs, or murmurs. No lower extremity edema. Pulmonary: decreased breath sounds bilaterally at bases, adequate air movement, no wheezing, rhonchi or rales. Gastrointestinal. Abdomen protuberant, no organomegaly, non tender, no rebound or guarding Skin. No rashes Musculoskeletal: no joint deformities     Data Reviewed: I have personally reviewed following labs and imaging studies  CBC: Recent Labs  Lab 04/17/18 2130 04/17/18 2141 04/18/18 0851 04/19/18 0318  WBC  --  6.7 7.5 7.8  NEUTROABS  --  3.9 4.4  --   HGB 13.6 11.3* 11.2* 9.7*  HCT 40.0 37.5 38.3 31.8*  MCV  --  92.1 94.6 91.6  PLT  --  277 256 243   Basic Metabolic Panel: Recent Labs  Lab 04/12/18 2021  04/14/18 0548 04/14/18 1833 04/15/18 0637 04/17/18 2130 04/17/18 2141 04/18/18 0851 04/19/18 0318  NA  --    < > 134*  --  137 131* 138 140 139  K  --    < > 4.0  --  3.9 7.9* 4.2 3.7 3.5  CL  --    < > 96*  --  97* 101 97* 100 100  CO2  --    < > 23  --  26  --  27 25 27   GLUCOSE  --    < > 128*  --  120* 159* 153* 152* 119*  BUN  --    < > 32*  --  31* 61* 38* 39* 41*  CREATININE  --    < > 1.26*  --  1.24* 1.60* 1.46* 1.46* 1.41*  CALCIUM  --    < > 7.1*  --  7.2*  --  7.8* 7.7* 7.4*  MG 1.2*  --   --  1.7  --   --   --   --   --    < > = values in this interval not displayed.   GFR: Estimated Creatinine Clearance: 28 mL/min (A) (by C-G formula based on SCr of 1.41 mg/dL (H)). Liver Function Tests: Recent Labs  Lab 04/17/18 2141 04/18/18 0851  AST 20 19  ALT 9 10  ALKPHOS 35* 33*  BILITOT 1.0 0.9  PROT 7.3 7.1  ALBUMIN 3.3* 3.3*   No results for input(s): LIPASE, AMYLASE in the last 168 hours. Recent Labs  Lab 04/18/18 0121  AMMONIA 33   Coagulation Profile: No results for input(s): INR, PROTIME in the last 168 hours. Cardiac Enzymes: Recent Labs    Lab 04/18/18 0851  TROPONINI 0.12*   BNP (last 3 results) No results for input(s): PROBNP in the last 8760 hours. HbA1C: No results for input(s): HGBA1C in the last 72 hours. CBG: Recent Labs  Lab 04/18/18 2041 04/19/18 0043 04/19/18 0447 04/19/18 0729 04/19/18 1139  GLUCAP 134* 113* 121* 135* 157*   Lipid Profile: No results for input(s): CHOL, HDL, LDLCALC, TRIG, CHOLHDL, LDLDIRECT in the last 72 hours. Thyroid Function Tests: Recent Labs    04/18/18 0851  TSH 11.074*   Anemia Panel:  No results for input(s): VITAMINB12, FOLATE, FERRITIN, TIBC, IRON, RETICCTPCT in the last 72 hours.    Radiology Studies: I have reviewed all of the imaging during this hospital visit personally     Scheduled Meds: . carvedilol  12.5 mg Oral BID WC  . insulin aspart  0-9 Units Subcutaneous Q4H  . [START ON 04/20/2018] levothyroxine  50 mcg Oral QAC breakfast   Continuous Infusions: . heparin 700 Units/hr (04/19/18 0650)     LOS: 0 days        Dreyson Mishkin Annett Gula, MD [facility] Pager [phone]

## 2018-04-19 NOTE — Care Management Obs Status (Signed)
MEDICARE OBSERVATION STATUS NOTIFICATION   Patient Details  Name: Tina Waters MRN: [id number] Date of Birth: [birth year]   Medicare Observation Status Notification Given:  Yes    Gala Lewandowsky, RN 04/19/2018, 11:32 AM

## 2018-04-19 NOTE — Progress Notes (Signed)
ANTICOAGULATION CONSULT NOTE Pharmacy Consult for heparin Indication: PE (intermediate probability)  Allergies  Allergen Reactions  . Penicillins Other (See Comments)    Has patient had a PCN reaction causing immediate rash, facial/tongue/throat swelling, SOB or lightheadedness with hypotension: Yes Has patient had a PCN reaction causing severe rash involving mucus membranes or skin necrosis: No Has patient had a PCN reaction that required hospitalization: No Has patient had a PCN reaction occurring within the last 10 years: No If all of the above answers are "NO", then may proceed with Cephalosporin use.    Patient Measurements: Height: 5\' 2"  (157.5 cm) Weight: 181 lb 12.8 oz (82.5 kg) IBW/kg (Calculated) : 50.1 Heparin Dosing Weight: 70kg  Vital Signs: Temp: 97.1 F (36.2 C) (09/11 0300) Temp Source: Rectal (09/11 0300) BP: 113/66 (09/11 0300) Pulse Rate: 69 (09/11 0300)  Labs: Recent Labs    04/17/18 2141 04/18/18 0851 04/18/18 1438 04/19/18 0318  HGB 11.3* 11.2*  --  9.7*  HCT 37.5 38.3  --  31.8*  PLT 277 256  --  243  HEPARINUNFRC  --   --  1.64* 1.58*  CREATININE 1.46* 1.46*  --  1.41*  TROPONINI  --  0.12*  --   --     Estimated Creatinine Clearance: 28 mL/min (A) (by C-G formula based on SCr of 1.41 mg/dL (H)).   Medical History: Past Medical History:  Diagnosis Date  . CHF (congestive heart failure) (HCC)   . Hypertension   . Stroke (HCC)   . Thyroid disease     Assessment: 84yo female continues on IV heparin for intermediate probability of PE. Heparin level remains elevated above goal at 1.58 on heparin drip 1000 units/hr. RN reports she drew the heparin level from upper extremity opposite of arm that heparin in infusing in, thus result is reliable.. No bleeding noted per RN's report.   Goal of Therapy:  Heparin level 0.3-0.7 units/ml Monitor platelets by anticoagulation protocol: Yes   Plan:  Hold heparin x 1 hour Decrease heparin to 700  units / hr  8 hour heparin level  Thank you Noah Delaine, RPh Clinical Pharmacist Pager: [phone] 04/19/2018,5:13 AM

## 2018-04-19 NOTE — Care Management Note (Addendum)
Case Management Note  Patient Details  Name: Tina Waters MRN: [id number] Date of Birth: 1928-03-10  Subjective/Objective: Pt presented for AMS and CP. PTA from [facility]- plan will be to transition back to facility once stable. Daughter at the bedside at time of conversation. CSW assisting with disposition needs.                  Action/Plan: CM will continue to follow for anticoagulant needs.   Expected Discharge Date:                  Expected Discharge Plan:  Skilled Nursing Facility  In-House Referral:  Clinical Social Work  Discharge planning Services  CM Consult  Post Acute Care Choice:  NA Choice offered to:  NA  DME Arranged:  N/A DME Agency:  NA  HH Arranged:  NA HH Agency:  NA  Status of Service:  Completed, signed off  If discussed at Long Length of Stay Meetings, dates discussed:    Additional Comments:  Gala Lewandowsky, RN 04/19/2018, 11:32 AM

## 2018-04-19 NOTE — Evaluation (Signed)
Clinical/Bedside Swallow Evaluation Patient Details  Name: Tina Waters MRN: [id number] Date of Birth: 28-Sep-1927  Today's Date: 04/19/2018 Time: SLP Start Time (ACUTE ONLY): 1015 SLP Stop Time (ACUTE ONLY): 1045 SLP Time Calculation (min) (ACUTE ONLY): 30 min  Past Medical History:  Past Medical History:  Diagnosis Date  . CHF (congestive heart failure) (HCC)   . Hypertension   . Stroke (HCC)   . Thyroid disease    Past Surgical History: History reviewed. No pertinent surgical history. HPI:  84 year old female admitted 04/17/18 from SNF with AMS and chest pain. PMH: sCHF, HTN, pacemaker, R CVA, hypothyroid, DM2, CKD, chronic anemia. Pt recently admitted/DC'd (CHF and renal failure)   Assessment / Plan / Recommendation Clinical Impression  Pt presents in similar fashion to BSE completed 04/10/18. No overt s/s aspiration observed during po presentations. Slow oral prep and propulsion of regular solids, which is baseline. Slight cough noted several minutes following po trials, raising suspicion for esophageal dysmotility. Recommend pt to remain at 45 degrees for 30 minutes after meals to facilitate esophageal clearing.   Will begin dys 3 (mechanical soft) solids and thin liquids, primarily for energy conservation. Safe swallow precautions posted at [facility] and reviewed with pt/daughter. SLP will follow briefly for education and assessment of diet tolerance. RN and MD informed of results and recommendations.  SLP Visit Diagnosis: Dysphagia, unspecified (R13.10)    Aspiration Risk  Mild aspiration risk    Diet Recommendation Thin liquid;Dysphagia 3 (Mech soft) , chopped meats  Liquid Administration via: Cup;Straw Medication Administration: Whole meds with liquid Supervision: Staff to assist with self feeding;Patient able to self feed;Intermittent supervision to cue for compensatory strategies Compensations: Minimize environmental distractions;Small sips/bites;Slow rate Postural Changes:  Seated upright at 90 degrees;Remain upright for at least 30 minutes after po intake    Other  Recommendations Oral Care Recommendations: Oral care BID   Follow up Recommendations None      Frequency and Duration min 1 x/week  1 week;2 weeks       Prognosis Prognosis for Safe Diet Advancement: Good      Swallow Study   General Date of Onset: 04/17/18 HPI: 84 year old female admitted 04/17/18 from SNF with AMS and chest pain. PMH: sCHF, HTN, pacemaker, R CVA, hypothyroid, DM2, CKD, chronic anemia. Pt recently admitted/DC'd (CHF and renal failure) Type of Study: Bedside Swallow Evaluation Previous Swallow Assessment: BSE 04/10/18 - reg/thin recommended. Diet Prior to this Study: NPO Temperature Spikes Noted: No Respiratory Status: Room air History of Recent Intubation: No Behavior/Cognition: Alert;Cooperative;Pleasant mood Oral Cavity Assessment: Within Functional Limits Oral Care Completed by SLP: No Oral Cavity - Dentition: Dentures, top;Missing dentition(missing lower dentition) Vision: Functional for self-feeding Self-Feeding Abilities: Needs set up;Needs assist;Able to feed self Patient Positioning: Upright in bed Baseline Vocal Quality: Normal Volitional Cough: Weak Volitional Swallow: Able to elicit    Oral/Motor/Sensory Function Overall Oral Motor/Sensory Function: Mild impairment(mild left side weakness - baseline)   Ice Chips Ice chips: Within functional limits Presentation: Spoon   Thin Liquid Thin Liquid: Within functional limits Presentation: Straw    Nectar Thick Nectar Thick Liquid: Not tested   Honey Thick Honey Thick Liquid: Not tested   Puree Puree: Within functional limits Presentation: Spoon   Solid     Solid: Within functional limits Presentation: Self Fed     Cottrell Gentles B. Murvin Natal, [facility], CCC-SLP Speech Language Pathologist [phone]  Leigh Aurora 04/19/2018,10:49 AM

## 2018-04-19 NOTE — Clinical Social Work Note (Signed)
Clinical Social Work Assessment  Patient Details  Name: Tina Waters MRN: [id number] Date of Birth: 07/23/1928  Date of referral:  04/19/18               Reason for consult:  Facility Placement, Discharge Planning                Permission sought to share information with:  Facility Contact Representative, Family Supports Permission granted to share information::  Yes, Verbal Permission Granted  Name::     Hollis Rorie  Agency::  [facility]  Relationship::  daughter  Contact Information:  [phone]  Housing/Transportation Living arrangements for the past 2 months:  Skilled Nursing Facility Source of Information:  Medical Team, Adult Children Patient Interpreter Needed:  None Criminal Activity/Legal Involvement Pertinent to Current Situation/Hospitalization:  No - Comment as needed Significant Relationships:  Adult Children, Other Family Members Lives with:  Facility Resident Do you feel safe going back to the place where you live?  Yes Need for family participation in patient care:  Yes (Comment)  Care giving concerns: Patient is a long term care resident at [facility].    Social Worker assessment / plan: CSW met with patient's daughter, Deborah, at bedside. Patient sleeping soundly, only woke briefly during conversation and acknowledged CSW. CSW introduced self and role and discussed disposition planning. Daughter is agreeable for patient to return to [facility] at discharge. CSW to follow for patient's medical readiness and will support with discharge planning.  Employment status:  Retired Insurance information:  Medicare, Medicaid In State PT Recommendations:  Not assessed at this time Information / Referral to community resources:  Skilled Nursing Facility  Patient/Family's Response to care: Daughter appreciative of care.  Patient/Family's Understanding of and Emotional Response to Diagnosis, Current Treatment, and Prognosis: Daughter has questions about  patient's condition. She is concerned about patient's lethargy. Daughter agreeable for patient to return to SNF when medically cleared.  Emotional Assessment Appearance:  Appears stated age Attitude/Demeanor/Rapport:  Unable to Assess Affect (typically observed):  Unable to Assess Orientation:  Oriented to Self Alcohol / Substance use:  Not Applicable Psych involvement (Current and /or in the community):  No (Comment)  Discharge Needs  Concerns to be addressed:  Discharge Planning Concerns, Care Coordination Readmission within the last 30 days:  Yes Current discharge risk:  Physical Impairment, Cognitively Impaired Barriers to Discharge:  Continued Medical Work up    , LCSW 04/19/2018, 11:24 AM  

## 2018-04-19 NOTE — Progress Notes (Signed)
ANTICOAGULATION CONSULT NOTE Pharmacy Consult for heparin Indication: PE (intermediate probability)  Allergies  Allergen Reactions  . Penicillins Other (See Comments)    Has patient had a PCN reaction causing immediate rash, facial/tongue/throat swelling, SOB or lightheadedness with hypotension: Yes Has patient had a PCN reaction causing severe rash involving mucus membranes or skin necrosis: No Has patient had a PCN reaction that required hospitalization: No Has patient had a PCN reaction occurring within the last 10 years: No If all of the above answers are "NO", then may proceed with Cephalosporin use.    Patient Measurements: Height: 5\' 2"  (157.5 cm) Weight: 181 lb 12.8 oz (82.5 kg) IBW/kg (Calculated) : 50.1 Heparin Dosing Weight: 70kg  Vital Signs: Temp: 97.6 F (36.4 C) (09/11 1140) Temp Source: Oral (09/11 1140) BP: 100/68 (09/11 1140) Pulse Rate: 70 (09/11 1140)  Labs: Recent Labs    04/17/18 2141 04/18/18 0851 04/18/18 1438 04/19/18 0318 04/19/18 1444  HGB 11.3* 11.2*  --  9.7*  --   HCT 37.5 38.3  --  31.8*  --   PLT 277 256  --  243  --   HEPARINUNFRC  --   --  1.64* 1.58* 1.24*  CREATININE 1.46* 1.46*  --  1.41*  --   TROPONINI  --  0.12*  --   --   --     Estimated Creatinine Clearance: 28 mL/min (A) (by C-G formula based on SCr of 1.41 mg/dL (H)).   Medical History: Past Medical History:  Diagnosis Date  . CHF (congestive heart failure) (HCC)   . Hypertension   . Stroke (HCC)   . Thyroid disease    Assessment: 84yo female continues on IV heparin for intermediate probability of PE.   Heparin level remains elevated at 1.24, despite rate decreases. Hgb 11.2>9.7, plt 243. Heparin is running through L midline, level drawn from R arm. No bleeding or infusion issues noted per RN's report.   Goal of Therapy:  Heparin level 0.3-0.7 units/ml Monitor platelets by anticoagulation protocol: Yes   Plan:  Hold heparin x 1 hour Decrease heparin to 500  units / hr  8 hour heparin level   Thank you Girard Cooter, PharmD Clinical Pharmacist  Pager: [phone] Phone: [phone] 04/19/2018,4:02 PM

## 2018-04-19 NOTE — Evaluation (Signed)
Physical Therapy Evaluation Patient Details Name: Tina Waters MRN: [id number] DOB: [birth year] Today's Date: 04/19/2018   History of Present Illness  Tina Waters is an 84 yo female who was recently admitted and discharged from the hospital several days ago for altered mental status, increasing left-sided weakness, CHF, renal failure, and suspected polypharmacy-induced encephalopathy. Yesterday morning she returned after being found confused and complaining of chest pain.  Clinical Impression  Patient presents with decreased mobility due to weakness (L>R), decreased balance, decreased safety and will benefit from skilled PT in the acute setting and resume PT in SNF upon d/c back to SNF level care. Currently +2 for safety with mobility up to EOB, was max A of 1 top sit EOB when last seen by PT in acute    Follow Up Recommendations SNF;Supervision/Assistance - 24 hour    Equipment Recommendations  None recommended by PT    Recommendations for Other Services       Precautions / Restrictions Precautions Precautions: Fall      Mobility  Bed Mobility Overal bed mobility: Needs Assistance Bed Mobility: Supine to Sit     Supine to sit: Max assist;+2 for physical assistance Sit to supine: +2 for physical assistance;Total assist   General bed mobility comments: assist for legs off bed and to lift trunk, scooted to EOB with pad under pt; assist to supine with legs and trunk  Transfers Overall transfer level: Needs assistance   Transfers: Lateral/Scoot Transfers          Lateral/Scoot Transfers: Mod assist;+2 physical assistance General transfer comment: assist to weight shift and scoot up to [facility] assisting with pad under hips  Ambulation/Gait                Stairs            Wheelchair Mobility    Modified Rankin (Stroke Patients Only)       Balance Overall balance assessment: Needs assistance Sitting-balance support: Bilateral upper extremity  supported Sitting balance-Leahy Scale: Poor Sitting balance - Comments: UE support in sitting at EOB                                     Pertinent Vitals/Pain Pain Assessment: Faces Faces Pain Scale: Hurts even more Pain Location: L shoulder with movement Pain Descriptors / Indicators: Grimacing;Guarding;Discomfort Pain Intervention(s): Monitored during session;Repositioned    Home Living Family/patient expects to be discharged to:: Skilled nursing facility                      Prior Function Level of Independence: Needs assistance   Gait / Transfers Assistance Needed: Reports she uses WC and needs assist with transfers from staff at SNF.            Hand Dominance   Dominant Hand: Right    Extremity/Trunk Assessment   Upper Extremity Assessment LUE Deficits / Details: holds L hand closed and c/o shoulder pain with shoulder elevated, pain with attempted PROM of shoulder    Lower Extremity Assessment Lower Extremity Assessment: LLE deficits/detail LLE Deficits / Details: AAROM WFL, strength 1-2/5 noted assisting with ROM    Cervical / Trunk Assessment Cervical / Trunk Assessment: Other exceptions Cervical / Trunk Exceptions: L lateral lean   Communication   Communication: No difficulties  Cognition Arousal/Alertness: Awake/alert Behavior During Therapy: Flat affect Overall Cognitive Status: History of cognitive impairments - at baseline  General Comments      Exercises     Assessment/Plan    PT Assessment Patient needs continued PT services  PT Problem List Decreased balance;Decreased strength;Decreased mobility;Decreased activity tolerance;Decreased safety awareness       PT Treatment Interventions DME instruction;Therapeutic activities;Functional mobility training;Therapeutic exercise;Balance training;Patient/family education    PT Goals (Current goals can be found in the  Care Plan section)  Acute Rehab PT Goals Patient Stated Goal: to go back to SNF at d/c  PT Goal Formulation: With patient Time For Goal Achievement: 05/03/18 Potential to Achieve Goals: Fair    Frequency Min 2X/week   Barriers to discharge        Co-evaluation               AM-PAC PT "6 Clicks" Daily Activity  Outcome Measure Difficulty turning over in bed (including adjusting bedclothes, sheets and blankets)?: Unable Difficulty moving from lying on back to sitting on the side of the bed? : Unable Difficulty sitting down on and standing up from a chair with arms (e.g., wheelchair, bedside commode, etc,.)?: Unable Help needed moving to and from a bed to chair (including a wheelchair)?: A Lot Help needed walking in hospital room?: Total Help needed climbing 3-5 steps with a railing? : Total 6 Click Score: 7    End of Session Equipment Utilized During Treatment: Gait belt Activity Tolerance: Patient limited by fatigue Patient left: with call bell/phone within reach;in bed;with family/visitor present   PT Visit Diagnosis: Other abnormalities of gait and mobility (R26.89);Muscle weakness (generalized) (M62.81)    Time: 1539-1600 PT Time Calculation (min) (ACUTE ONLY): 21 min   Charges:   PT Evaluation $PT Eval Moderate Complexity: 1 Mod          Sheran Lawless, [facility] [phone] 04/19/2018   Elray Mcgregor 04/19/2018, 4:59 PM

## 2018-04-20 DIAGNOSIS — E039 Hypothyroidism, unspecified: Secondary | ICD-10-CM

## 2018-04-20 DIAGNOSIS — I824Y2 Acute embolism and thrombosis of unspecified deep veins of left proximal lower extremity: Secondary | ICD-10-CM

## 2018-04-20 LAB — CBC
HCT: 33.1 % — ABNORMAL LOW (ref 36.0–46.0)
Hemoglobin: 10.1 g/dL — ABNORMAL LOW (ref 12.0–15.0)
MCH: 28.1 pg (ref 26.0–34.0)
MCHC: 30.5 g/dL (ref 30.0–36.0)
MCV: 91.9 fL (ref 78.0–100.0)
Platelets: 257 10*3/uL (ref 150–400)
RBC: 3.6 MIL/uL — ABNORMAL LOW (ref 3.87–5.11)
RDW: 16 % — AB (ref 11.5–15.5)
WBC: 7.9 10*3/uL (ref 4.0–10.5)

## 2018-04-20 LAB — GLUCOSE, CAPILLARY
GLUCOSE-CAPILLARY: 142 mg/dL — AB (ref 70–99)
GLUCOSE-CAPILLARY: 149 mg/dL — AB (ref 70–99)
Glucose-Capillary: 125 mg/dL — ABNORMAL HIGH (ref 70–99)
Glucose-Capillary: 141 mg/dL — ABNORMAL HIGH (ref 70–99)

## 2018-04-20 LAB — HEPARIN LEVEL (UNFRACTIONATED)
HEPARIN UNFRACTIONATED: 1.58 [IU]/mL — AB (ref 0.30–0.70)
Heparin Unfractionated: 0.68 IU/mL (ref 0.30–0.70)

## 2018-04-20 LAB — BASIC METABOLIC PANEL
Anion gap: 16 — ABNORMAL HIGH (ref 5–15)
BUN: 47 mg/dL — AB (ref 8–23)
CALCIUM: 7.4 mg/dL — AB (ref 8.9–10.3)
CO2: 22 mmol/L (ref 22–32)
Chloride: 98 mmol/L (ref 98–111)
Creatinine, Ser: 1.65 mg/dL — ABNORMAL HIGH (ref 0.44–1.00)
GFR calc Af Amer: 31 mL/min — ABNORMAL LOW (ref >60)
GFR calc non Af Amer: 27 mL/min — ABNORMAL LOW (ref >60)
GLUCOSE: 136 mg/dL — AB (ref 70–99)
Potassium: 3.9 mmol/L (ref 3.5–5.1)
Sodium: 136 mmol/L (ref 135–145)

## 2018-04-20 LAB — APTT: aPTT: 157 seconds — ABNORMAL HIGH (ref 24–36)

## 2018-04-20 MED ORDER — APIXABAN 5 MG PO TABS: 10.0000 mg | ORAL_TABLET | Freq: Two times a day (BID) | ORAL | 0 refills | Status: DC

## 2018-04-20 MED ORDER — APIXABAN 5 MG PO TABS
10.0000 mg | ORAL_TABLET | Freq: Two times a day (BID) | ORAL | Status: DC
  Administered 2018-04-20: 10 mg via ORAL
  Filled 2018-04-20: qty 2

## 2018-04-20 MED ORDER — FUROSEMIDE 40 MG PO TABS: ORAL_TABLET | ORAL | Status: AC

## 2018-04-20 NOTE — Progress Notes (Signed)
Attempted to give report to [employer]  Hinton Dyer, RN

## 2018-04-20 NOTE — Discharge Summary (Signed)
Physician Discharge Summary  Tina Waters ZOX:[id number] DOB: 1928-01-29 DOA: 04/17/2018  PCP: Eloisa Northern, MD  Admit date: 04/17/2018 Discharge date: 04/20/2018  Admitted From: SNF  Disposition:  SNF   Recommendations for Outpatient Follow-up and new medication changes:  1. Follow up with Dr. Nelson Chimes in 7 days 2. Please obtain basic metabolic panel on 04/24/2018 3. Patient has been placed on apixaban for anticoagulation  4. Patient may benefit from palliative care services  Home Health: Na  Equipment/Devices: Na   Discharge Condition: stable  CODE STATUS: DNR   Diet recommendation: heart healthy   Brief/Interim Summary: 84 year old female who presented with chest pain and altered mentation.  She does have the significant past medical history for systolic heart failure, CVA with left-sided hemiparesis, type 2 diabetes mellitus, chronic kidney disease stage 3, hypothyroidism and chronic anemia.  Patient was discharged on March 15, 2018 from the hospital after being treated for toxic encephalopathy and decompensated heart failure.  Patient is wheelchair-bound, bedridden due to left sided hemiparesis for approximately 9 months.  Unable to get detailed information on admission due to her baseline dementia, but per her daughter she was not being herself for the last 24 hours prior to hospitalization, with worsening confusion and disorientation.  Her initial physical examination blood pressure 114/91, heart rate 75, respiratory rate 25, oxygen saturation 100%.  Moist mucous membranes, lungs clear to auscultation bilaterally, heart S1-S2 present and rhythmic, abdomen protuberant, no lower extremity edema, left-sided hemiparesis.  Patient was lethargic but able to follow commands.  Sodium 138, potassium 4.2, chloride 97, bicarb 27, glucose 153, BUN 38, creatinine 1.46, BNP 4500, troponin 0.12, white count 6.7, hemoglobin 9.3, hematocrit 37.5, platelets 277, urine analysis negative for infection.  Head CT with  unchanged subacute/chronic small left basal ganglia/supra insular lacunar infarct.  Chest radiograph with left rotation, atelectasis at left base.  EKG with atrial pacing, right bundle branch block.  Patient was admitted to the hospital with the working diagnosis of acute chest pain complicated by metabolic encephalopathy.  1.  Acute pulmonary embolism with acute left lower extremity deep vein thrombosis, complicated by pulmonary hypertension. Further work-up with lower extremity ultrasonography showed acute deep vein thrombosis involving the left common femoral vein, left femoral vein and left peroneal vein.  No evidence of thrombosis on the right lower extremity.  Pulmonary VQ scan was indeterminate for pulmonary embolism but considering patient's symptoms, elevated troponin and positive ultrasonography for deep vein thrombosis, likely patient does have an acute pulmonary embolism.  She was placed on IV heparin with good toleration, further work-up with repeat echocardiography showed dilated right ventricle cavity with moderately reduced systolic function.  Her peak PA pressure was 68 mmHg. She was transition with apixaban, considering risk factors likely she will need long-term anticoagulation.   2.  Acute metabolic encephalopathy.  Patient does have baseline dementia but clearly was not at her baseline per her family members.  She had episodes of confusion during her hospitalization with features of delirium.  She received supportive medical therapy, at discharge she is calm and not agitated.  3.  Chronic systolic heart failure/ with no exacerbation.  Continue beta-blockade with carvedilol, holding ACE inhibitors due to decreased GFR.  Follow-up echocardiography showed further reduction in LV systolic function down to 15%.  Diffuse hypokinesis.  Her EKG had no signs of acute coronary syndrome.  Considering patient's dementia, left hemiparesis and poor quality of life she may benefit from palliative care  services.  Will recommend to use furosemide only  as needed for weight gain (3 pounds in 24 hours or 5 pounds in 7 days) or dyspnea,  4.  Stage III chronic kidney disease.  Her baseline creatinine seems to be 1.4-1.6, calculated GFR is 32.  Her discharge potassium is 3.9, serum bicarbonate 22.  Will need a very close follow-up on kidney function and electrolytes as an outpatient.  For now will recommend using furosemide only as needed.  Follow-up renal panel on September 16.  5.  Hypertension.  Continue carvedilol, she does have a significant reduction on her left ventricle systolic function.  6.  Hypothyroid.  Continue levothyroxine.  7.  Chronic CVA with left-sided hemiparesis. Patient is wheelchair-bound, baseline dementia, poor prognosis.  8.  Type 2 diabetes mellitus.  Continue insulin sliding scale for glucose coverage and monitor.   Discharge Diagnoses:  Principal Problem:   Acute encephalopathy Active Problems:   Acute on chronic systolic CHF (congestive heart failure) (HCC)   Hypothyroidism   Insulin-requiring or dependent type II diabetes mellitus (HCC)   History of CVA with residual deficit   CKD (chronic kidney disease) stage 3, GFR 30-59 ml/min (HCC)   Pulmonary embolism (HCC)   DVT, lower extremity, proximal, acute (HCC)    Discharge Instructions   Allergies as of 04/20/2018      Reactions   Penicillins Other (See Comments)   Has patient had a PCN reaction causing immediate rash, facial/tongue/throat swelling, SOB or lightheadedness with hypotension: Yes Has patient had a PCN reaction causing severe rash involving mucus membranes or skin necrosis: No Has patient had a PCN reaction that required hospitalization: No Has patient had a PCN reaction occurring within the last 10 years: No If all of the above answers are "NO", then may proceed with Cephalosporin use.      Medication List    STOP taking these medications   aspirin EC 81 MG tablet     TAKE these  medications   acetaminophen 500 MG tablet Commonly known as:  TYLENOL Take 500 mg by mouth every 8 (eight) hours as needed for moderate pain.   apixaban 5 MG Tabs tablet Commonly known as:  ELIQUIS Take 2 tablets (10 mg total) by mouth 2 (two) times daily.   ARTIFICIAL TEARS 0.2-0.2-1 % Soln Generic drug:  Glycerin-Hypromellose-PEG 400 Place 1 drop into both eyes every 12 (twelve) hours as needed (for dry eyes).   atorvastatin 20 MG tablet Commonly known as:  LIPITOR Take 20 mg by mouth at bedtime.   carvedilol 12.5 MG tablet Commonly known as:  COREG Take 12.5 mg by mouth 2 (two) times daily.   cholecalciferol 1000 units tablet Commonly known as:  VITAMIN D Take 1,000 Units by mouth daily.   cinacalcet 30 MG tablet Commonly known as:  SENSIPAR Take 30 mg by mouth 2 (two) times daily.   DERMACLOUD Crea Apply 1 application topically 3 (three) times daily. Apply to sacrum and buttocks topically every shift to protect skin from breakdown   furosemide 40 MG tablet Commonly known as:  LASIX Take once daily as needed for shortness of breath or weight gain three lbs in 24 hours or five lbs in 7 days. What changed:    how much to take  how to take this  when to take this  additional instructions   insulin aspart 100 UNIT/ML injection Commonly known as:  novoLOG Inject 0-10 Units into the skin See admin instructions. Inject 0-10 units into the skin three times a day before meals and at bedtime,  per sliding scale: BGL 0-150 = 0 unit; 151-200 = 2 units; 201-250 = 4 units; 251-300 = 6 units; 301-350 = 8 units; 351-400 = 10 units; >400 = CALL MD   levothyroxine 50 MCG tablet Commonly known as:  SYNTHROID, LEVOTHROID Take 50 mcg by mouth daily before breakfast.   loperamide 2 MG tablet Commonly known as:  IMODIUM A-D Take 2 mg by mouth every 6 (six) hours as needed (for diarrhea).   nitroGLYCERIN 0.4 MG SL tablet Commonly known as:  NITROSTAT Place 0.4 mg under the tongue  every 5 (five) minutes x 3 doses as needed (for chest pain and CALL MD IF NO RELIEF).   pantoprazole 40 MG tablet Commonly known as:  PROTONIX Take 40 mg by mouth daily.       Allergies  Allergen Reactions  . Penicillins Other (See Comments)    Has patient had a PCN reaction causing immediate rash, facial/tongue/throat swelling, SOB or lightheadedness with hypotension: Yes Has patient had a PCN reaction causing severe rash involving mucus membranes or skin necrosis: No Has patient had a PCN reaction that required hospitalization: No Has patient had a PCN reaction occurring within the last 10 years: No If all of the above answers are "NO", then may proceed with Cephalosporin use.    Consultations:     Procedures/Studies: Dg Chest 2 View  Result Date: 04/08/2018 CLINICAL DATA:  Pt BIB [facility] from [facility], staff noted pt has had decreased LOC since Thursday, hx dementia, CVA with deficits on the left. At baseline pt able to care for herself with assistance. At this time, pt answering questions appropriately EXAM: CHEST - 2 VIEW COMPARISON:  none FINDINGS: Low lung volumes. Suspect central pulmonary vascular congestion. Consolidation/atelectasis in the lung bases, left greater than right. Cardiomegaly. Left subclavian transvenous pacemaker. Aortic Atherosclerosis (ICD10-170.0). Moderate left and smaller right pleural effusions.  No pneumothorax. DJD in the left shoulder.  Hiatal hernia. IMPRESSION: 1. Cardiomegaly, pleural effusions, and central pulmonary vascularity suggesting CHF. 2. Bibasilar consolidation/atelectasis, left greater than right. Electronically Signed   By: Corlis Leak M.D.   On: 04/08/2018 22:07   Ct Head Wo Contrast  Result Date: 04/18/2018 CLINICAL DATA:  Altered mental status. EXAM: CT HEAD WITHOUT CONTRAST TECHNIQUE: Contiguous axial images were obtained from the base of the skull through the vertex without intravenous contrast. COMPARISON:  Head CT  04/10/2018 and 04/08/2018 FINDINGS: Brain: Unchanged left basal ganglia/subinsular subacute or chronic lacunar infarct from prior exam. No hemorrhagic transformation. No evidence of new ischemia or hemorrhage elsewhere. No subdural or extra-axial fluid collection. Unchanged degree of atrophy and chronic small vessel ischemia. No hydrocephalus. Vascular: No hyperdense vessel. Atherosclerosis of skull base vasculature. Skull: No focal lesion or fracture. Diffuse calvarial heterogeneity likely osteopenia/osteoporosis. Sinuses/Orbits: Prior cataract resection.  No acute findings. Other: None. IMPRESSION: 1.  No acute intracranial abnormality. 2. Unchanged subacute or chronic small left basal ganglia/subinsular lacunar infarct since prior exam. Unchanged atrophy and chronic small vessel ischemia. Electronically Signed   By: Narda Rutherford M.D.   On: 04/18/2018 05:18   Ct Head Wo Contrast  Result Date: 04/10/2018 CLINICAL DATA:  84 year old female with new left basal ganglia infarct. Follow-up CT scan prior to discharge. EXAM: CT HEAD WITHOUT CONTRAST TECHNIQUE: Contiguous axial images were obtained from the base of the skull through the vertex without intravenous contrast. COMPARISON:  Prior head CT 04/08/2018 FINDINGS: Brain: No evidence of hemorrhagic transformation. Expected evolution of ill-defined low-attenuation in the inferior left basal ganglia  and subinsular cortex. Stable cortical atrophy and chronic microvascular ischemic white matter disease. Vascular: No hyperdense vessel or unexpected calcification. Bilateral cavernous and supraclinoid carotid artery atherosclerotic calcifications. Skull: Normal. Negative for fracture or focal lesion. Sinuses/Orbits: No acute finding. Other: None. IMPRESSION: 1. No new acute intracranial abnormality or evidence of hemorrhagic conversion. 2. Expected evolution of left inferior basal ganglia and subinsular cortical infarct. Electronically Signed   By: Malachy Moan  M.D.   On: 04/10/2018 12:12   Ct Head Wo Contrast  Result Date: 04/08/2018 CLINICAL DATA:  Patient with decreased level of consciousness. EXAM: CT HEAD WITHOUT CONTRAST TECHNIQUE: Contiguous axial images were obtained from the base of the skull through the vertex without intravenous contrast. COMPARISON:  None. FINDINGS: Brain: Patchy hypodensity within the left basal ganglia (image 15; series 3). Ventricles and sulci are prominent compatible with atrophy. No evidence for intracranial hemorrhage, mass lesion or mass-effect. Vascular: Internal carotid arterial vascular calcifications. Skull: Intact. Sinuses/Orbits: Paranasal sinuses are well aerated. Mastoid air cells are unremarkable. Orbits unremarkable. Other: None. IMPRESSION: 1. Hypodensity within the left basal ganglia favored to represent subacute to chronic left basal ganglia infarct. Electronically Signed   By: Annia Belt M.D.   On: 04/08/2018 22:40   Nm Pulmonary Vent And Perf (v/q Scan)  Result Date: 04/18/2018 CLINICAL DATA:  84 year old presenting with acute chest pain and elevated D-dimer. Stage 3 chronic kidney disease which precludes IV contrast for CTA chest. EXAM: NUCLEAR MEDICINE VENTILATION - PERFUSION LUNG SCAN TECHNIQUE: Ventilation images were obtained in multiple projections using inhaled aerosol Tc-36m DTPA. Perfusion images were obtained in multiple projections after intravenous injection of Tc-33m-MAA. RADIOPHARMACEUTICALS:  31 mCi of Tc-72m DTPA aerosol inhalation and 4.25 mCi Tc2m-MAA IV. COMPARISON:  No prior nuclear imaging. Portable chest x-ray yesterday is correlated. FINDINGS: Ventilation: Large multi-segment ventilatory defects involving the LEFT UPPER LOBE and LEFT LOWER LOBE. Segmental ventilatory defect involving the POSTERIOR RIGHT UPPER LOBE. Perfusion: Matched perfusion defects in both lungs. No mismatched perfusion defects. IMPRESSION: Intermediate probability of pulmonary embolism based on PIOPED II criteria.  Electronically Signed   By: Hulan Saas M.D.   On: 04/18/2018 11:25   Dg Chest Port 1 View  Result Date: 04/17/2018 CLINICAL DATA:  Altered mental status, intermittent chest pain, history CHF, hypertension, stroke EXAM: PORTABLE CHEST 1 VIEW COMPARISON:  Portable exam 2007 hours compared to 04/11/2018 FINDINGS: LEFT subclavian sequential pacemaker with leads projecting over enlarged RIGHT atrium and RIGHT ventricle. Enlargement of cardiac silhouette. Mediastinal contours and pulmonary vascularity normal. Atherosclerotic calcification aorta. Persistent atelectasis versus consolidation of LEFT lower lobe. Improved aeration at RIGHT base since previous exam, with minimal residual RIGHT pleural effusion and basilar atelectasis. No pneumothorax. 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 IMPRESSION: Enlargement of cardiac silhouette post pacemaker. Persistent atelectasis versus consolidation LEFT lower lobe. Improved aeration RIGHT base. Electronically Signed   By: Ulyses Southward M.D.   On: 04/17/2018 20:28   Dg Chest Port 1 View  Result Date: 04/11/2018 CLINICAL DATA:  Short of breath EXAM: PORTABLE CHEST 1 VIEW COMPARISON:  04/08/2018 FINDINGS: Left-sided pacing device as before. Cardiomegaly with vascular congestion and mild pulmonary edema. Bilateral pleural effusions. Bibasilar consolidations. Moderate to large hiatal hernia. Aortic atherosclerosis. No pneumothorax. Right shoulder deformity with discontinuity at the [facility] joint, humeral head deformity, and subluxed appearance of the right humeral head. IMPRESSION: 1. Cardiomegaly with vascular congestion and pulmonary edema, not significantly changed. 2. Moderate pleural effusions  and bibasilar consolidations, unchanged 3. Large hiatal hernia Electronically Signed   By: Jasmine Pang M.D.   On: 04/11/2018 14:13       Subjective: Patient is feeling better, no  dyspnea or chest pain, no nausea or vomiting.   Discharge Exam: Vitals:   04/20/18 0622 04/20/18 0738  BP:  (!) 107/59  Pulse: 70 68  Resp:    Temp:  (!) 97.5 F (36.4 C)  SpO2:  98%   Vitals:   04/20/18 0016 04/20/18 0559 04/20/18 0622 04/20/18 0738  BP: 99/77 108/65  (!) 107/59  Pulse: 70 68 70 68  Resp: 16     Temp: (!) 97.5 F (36.4 C) (!) 97.4 F (36.3 C)  (!) 97.5 F (36.4 C)  TempSrc: Oral Oral  Oral  SpO2: 100%   98%  Weight:  86.2 kg    Height:        General: Not in pain or dyspnea, Neurology: Awake, confused and disorientated.  E ENT: no pallor, no icterus, oral mucosa moist Cardiovascular: No JVD. S1-S2 present, rhythmic, no gallops, rubs, or murmurs. Positive non pitting left lower extremity edema. Pulmonary: positive breath sounds bilaterally, decreased air movement at the dependent zones, no wheezing, rhonchi or rales. Gastrointestinal. Abdomen with no organomegaly, non tender, no rebound or guarding Skin. No rashes Musculoskeletal: no joint deformities   The results of significant diagnostics from this hospitalization (including imaging, microbiology, ancillary and laboratory) are listed below for reference.     Microbiology: No results found for this or any previous visit (from the past 240 hour(s)).   Labs: BNP (last 3 results) Recent Labs    04/09/18 0017 04/11/18 1442 04/17/18 2141  BNP >4,500.0* >4,500.0* >4,500.0*   Basic Metabolic Panel: Recent Labs  Lab 04/14/18 1833 04/15/18 1610 04/17/18 2130 04/17/18 2141 04/18/18 0851 04/19/18 0318 04/20/18 0219  NA  --  137 131* 138 140 139 136  K  --  3.9 7.9* 4.2 3.7 3.5 3.9  CL  --  97* 101 97* 100 100 98  CO2  --  26  --  27 25 27 22   GLUCOSE  --  120* 159* 153* 152* 119* 136*  BUN  --  31* 61* 38* 39* 41* 47*  CREATININE  --  1.24* 1.60* 1.46* 1.46* 1.41* 1.65*  CALCIUM  --  7.2*  --  7.8* 7.7* 7.4* 7.4*  MG 1.7  --   --   --   --   --   --    Liver Function Tests: Recent Labs   Lab 04/17/18 2141 04/18/18 0851  AST 20 19  ALT 9 10  ALKPHOS 35* 33*  BILITOT 1.0 0.9  PROT 7.3 7.1  ALBUMIN 3.3* 3.3*   No results for input(s): LIPASE, AMYLASE in the last 168 hours. Recent Labs  Lab 04/18/18 0121  AMMONIA 33   CBC: Recent Labs  Lab 04/17/18 2130 04/17/18 2141 04/18/18 0851 04/19/18 0318 04/20/18 0219  WBC  --  6.7 7.5 7.8 7.9  NEUTROABS  --  3.9 4.4  --   --   HGB 13.6 11.3* 11.2* 9.7* 10.1*  HCT 40.0 37.5 38.3 31.8* 33.1*  MCV  --  92.1 94.6 91.6 91.9  PLT  --  277 256 243 257   Cardiac Enzymes: Recent Labs  Lab 04/18/18 0851  TROPONINI 0.12*   BNP: Invalid input(s): POCBNP CBG: Recent Labs  Lab 04/19/18 1617 04/19/18 1954 04/20/18 0012 04/20/18 0601 04/20/18 0733  GLUCAP 163* 157* 125*  141* 142*   D-Dimer Recent Labs    04/18/18 0121  DDIMER 4.78*   Hgb A1c No results for input(s): HGBA1C in the last 72 hours. Lipid Profile No results for input(s): CHOL, HDL, LDLCALC, TRIG, CHOLHDL, LDLDIRECT in the last 72 hours. Thyroid function studies Recent Labs    04/18/18 0851  TSH 11.074*   Anemia work up No results for input(s): VITAMINB12, FOLATE, FERRITIN, TIBC, IRON, RETICCTPCT in the last 72 hours. Urinalysis    Component Value Date/Time   COLORURINE YELLOW 04/18/2018 0304   APPEARANCEUR CLEAR 04/18/2018 0304   LABSPEC 1.011 04/18/2018 0304   PHURINE 5.0 04/18/2018 0304   GLUCOSEU NEGATIVE 04/18/2018 0304   HGBUR NEGATIVE 04/18/2018 0304   BILIRUBINUR NEGATIVE 04/18/2018 0304   KETONESUR NEGATIVE 04/18/2018 0304   PROTEINUR NEGATIVE 04/18/2018 0304   NITRITE NEGATIVE 04/18/2018 0304   LEUKOCYTESUR NEGATIVE 04/18/2018 0304   Sepsis Labs Invalid input(s): PROCALCITONIN,  WBC,  LACTICIDVEN Microbiology No results found for this or any previous visit (from the past 240 hour(s)).   Time coordinating discharge: 45 minutes  SIGNED:   Coralie Keens, MD  [facility] 04/20/2018, 10:15 AM Pager  [phone]  If 7PM-7AM, please contact night-coverage [email or web address] Password [email or web address]

## 2018-04-20 NOTE — Progress Notes (Addendum)
Patient will discharge back to [facility]. Anticipated discharge date: 04/20/18 Family notified: Lauro Regulus, daughter (left voicemail) Transportation by: [facility]  Nurse to call report to [phone]. Patient will go to room 207A.   CSW signing off.  Abigail Butts, LCSWA  Clinical Social Worker

## 2018-04-20 NOTE — Progress Notes (Addendum)
ANTICOAGULATION CONSULT NOTE Pharmacy Consult for heparin to Eliquis Indication: PE (intermediate probability)  Allergies  Allergen Reactions  . Penicillins Other (See Comments)    Has patient had a PCN reaction causing immediate rash, facial/tongue/throat swelling, SOB or lightheadedness with hypotension: Yes Has patient had a PCN reaction causing severe rash involving mucus membranes or skin necrosis: No Has patient had a PCN reaction that required hospitalization: No Has patient had a PCN reaction occurring within the last 10 years: No If all of the above answers are "NO", then may proceed with Cephalosporin use.    Patient Measurements: Height: 5\' 2"  (157.5 cm) Weight: 190 lb 0.6 oz (86.2 kg) IBW/kg (Calculated) : 50.1 Heparin Dosing Weight: 70kg  Vital Signs: Temp: 97.5 F (36.4 C) (09/12 0738) Temp Source: Oral (09/12 0738) BP: 107/59 (09/12 0738) Pulse Rate: 68 (09/12 0738)  Labs: Recent Labs    04/18/18 0851  04/19/18 0318 04/19/18 1444 04/20/18 0219  HGB 11.2*  --  9.7*  --  10.1*  HCT 38.3  --  31.8*  --  33.1*  PLT 256  --  243  --  257  APTT  --   --   --   --  157*  HEPARINUNFRC  --    < > 1.58* 1.24* 0.68  CREATININE 1.46*  --  1.41*  --  1.65*  TROPONINI 0.12*  --   --   --   --    < > = values in this interval not displayed.    Estimated Creatinine Clearance: 24.5 mL/min (A) (by C-G formula based on SCr of 1.65 mg/dL (H)).   Medical History: Past Medical History:  Diagnosis Date  . CHF (congestive heart failure) (HCC)   . Hypertension   . Stroke (HCC)   . Thyroid disease    Assessment: 84yo female continues on IV heparin for intermediate probability of PE/ confirmed DVT To transition from heparin to Eliquis  Goal of Therapy:  Monitor platelets by anticoagulation protocol: Yes   Plan:  DC heparin Eliquis 10 mg po BID x 7 days then 5 mg po BID Follow up CBC   Thank you Okey Regal, PharmD [phone] 04/20/2018,8:36 AM

## 2018-04-20 NOTE — Clinical Social Work Placement (Signed)
   CLINICAL SOCIAL WORK PLACEMENT  NOTE  Date:  04/20/2018  Patient Details  Name: Tina Waters MRN: [id number] Date of Birth: 09-29-1927  Clinical Social Work is seeking post-discharge placement for this patient at the Skilled  Nursing Facility level of care (*CSW will initial, date and re-position this form in  chart as items are completed):  Yes   Patient/family provided with [facility] Clinical Social Work Department's list of facilities offering this level of care within the geographic area requested by the patient (or if unable, by the patient's family).  Yes   Patient/family informed of their freedom to choose among providers that offer the needed level of care, that participate in Medicare, Medicaid or managed care program needed by the patient, have an available bed and are willing to accept the patient.  Yes   Patient/family informed of [facility]'s ownership interest in [facility] and [facility], as well as of the fact that they are under no obligation to receive care at these facilities.  PASRR submitted to EDS on       PASRR number received on       Existing PASRR number confirmed on 04/19/18     FL2 transmitted to all facilities in geographic area requested by pt/family on 04/19/18     FL2 transmitted to all facilities within larger geographic area on       Patient informed that his/her managed care company has contracts with or will negotiate with certain facilities, including the following:  [facility]     Yes   Patient/family informed of bed offers received.  Patient chooses bed at [facility]     Physician recommends and patient chooses bed at      Patient to be transferred to [facility] on 04/20/18.  Patient to be transferred to facility by [facility]     Patient family notified on 04/20/18 of transfer.  Name of family member notified:        PHYSICIAN Please prepare prescriptions, Please sign DNR     Additional  Comment:    _______________________________________________ Abigail Butts, LCSW 04/20/2018, 11:27 AM

## 2018-04-21 ENCOUNTER — Emergency Department (HOSPITAL_COMMUNITY): Payer: Medicare Other

## 2018-04-21 ENCOUNTER — Inpatient Hospital Stay (HOSPITAL_COMMUNITY)
Admission: EM | Admit: 2018-04-21 | Discharge: 2018-05-09 | DRG: 291 | Disposition: E | Payer: Medicare Other | Attending: Internal Medicine | Admitting: Internal Medicine

## 2018-04-21 ENCOUNTER — Encounter (HOSPITAL_COMMUNITY): Payer: Self-pay

## 2018-04-21 ENCOUNTER — Other Ambulatory Visit: Payer: Self-pay

## 2018-04-21 DIAGNOSIS — I6930 Unspecified sequelae of cerebral infarction: Secondary | ICD-10-CM | POA: Diagnosis not present

## 2018-04-21 DIAGNOSIS — I824Y9 Acute embolism and thrombosis of unspecified deep veins of unspecified proximal lower extremity: Secondary | ICD-10-CM | POA: Diagnosis present

## 2018-04-21 DIAGNOSIS — Z880 Allergy status to penicillin: Secondary | ICD-10-CM | POA: Diagnosis not present

## 2018-04-21 DIAGNOSIS — Z794 Long term (current) use of insulin: Secondary | ICD-10-CM | POA: Diagnosis not present

## 2018-04-21 DIAGNOSIS — Z79890 Hormone replacement therapy (postmenopausal): Secondary | ICD-10-CM

## 2018-04-21 DIAGNOSIS — F0390 Unspecified dementia without behavioral disturbance: Secondary | ICD-10-CM | POA: Diagnosis present

## 2018-04-21 DIAGNOSIS — Z7901 Long term (current) use of anticoagulants: Secondary | ICD-10-CM | POA: Diagnosis not present

## 2018-04-21 DIAGNOSIS — E1122 Type 2 diabetes mellitus with diabetic chronic kidney disease: Secondary | ICD-10-CM | POA: Diagnosis present

## 2018-04-21 DIAGNOSIS — I428 Other cardiomyopathies: Secondary | ICD-10-CM | POA: Diagnosis present

## 2018-04-21 DIAGNOSIS — G9341 Metabolic encephalopathy: Secondary | ICD-10-CM | POA: Diagnosis present

## 2018-04-21 DIAGNOSIS — I272 Pulmonary hypertension, unspecified: Secondary | ICD-10-CM | POA: Diagnosis present

## 2018-04-21 DIAGNOSIS — N183 Chronic kidney disease, stage 3 unspecified: Secondary | ICD-10-CM | POA: Diagnosis present

## 2018-04-21 DIAGNOSIS — R570 Cardiogenic shock: Secondary | ICD-10-CM | POA: Diagnosis present

## 2018-04-21 DIAGNOSIS — N179 Acute kidney failure, unspecified: Secondary | ICD-10-CM | POA: Diagnosis present

## 2018-04-21 DIAGNOSIS — Z7189 Other specified counseling: Secondary | ICD-10-CM

## 2018-04-21 DIAGNOSIS — I2699 Other pulmonary embolism without acute cor pulmonale: Secondary | ICD-10-CM | POA: Diagnosis present

## 2018-04-21 DIAGNOSIS — Z515 Encounter for palliative care: Secondary | ICD-10-CM | POA: Diagnosis not present

## 2018-04-21 DIAGNOSIS — I5023 Acute on chronic systolic (congestive) heart failure: Secondary | ICD-10-CM | POA: Diagnosis present

## 2018-04-21 DIAGNOSIS — Z79899 Other long term (current) drug therapy: Secondary | ICD-10-CM | POA: Diagnosis not present

## 2018-04-21 DIAGNOSIS — Z66 Do not resuscitate: Secondary | ICD-10-CM | POA: Diagnosis present

## 2018-04-21 DIAGNOSIS — A419 Sepsis, unspecified organism: Secondary | ICD-10-CM

## 2018-04-21 DIAGNOSIS — E039 Hypothyroidism, unspecified: Secondary | ICD-10-CM | POA: Diagnosis present

## 2018-04-21 DIAGNOSIS — E119 Type 2 diabetes mellitus without complications: Secondary | ICD-10-CM

## 2018-04-21 DIAGNOSIS — I130 Hypertensive heart and chronic kidney disease with heart failure and stage 1 through stage 4 chronic kidney disease, or unspecified chronic kidney disease: Secondary | ICD-10-CM | POA: Diagnosis not present

## 2018-04-21 DIAGNOSIS — R0602 Shortness of breath: Secondary | ICD-10-CM

## 2018-04-21 DIAGNOSIS — I82409 Acute embolism and thrombosis of unspecified deep veins of unspecified lower extremity: Secondary | ICD-10-CM | POA: Diagnosis present

## 2018-04-21 LAB — URINALYSIS, ROUTINE W REFLEX MICROSCOPIC
Bilirubin Urine: NEGATIVE
Glucose, UA: NEGATIVE mg/dL
Hgb urine dipstick: NEGATIVE
Ketones, ur: NEGATIVE mg/dL
Leukocytes, UA: NEGATIVE
Nitrite: NEGATIVE
PH: 5 (ref 5.0–8.0)
Protein, ur: 30 mg/dL — AB
SPECIFIC GRAVITY, URINE: 1.015 (ref 1.005–1.030)

## 2018-04-21 LAB — CBC WITH DIFFERENTIAL/PLATELET
Abs Immature Granulocytes: 0.2 10*3/uL — ABNORMAL HIGH (ref 0.0–0.1)
BASOS ABS: 0 10*3/uL (ref 0.0–0.1)
BASOS PCT: 0 %
EOS ABS: 0.1 10*3/uL (ref 0.0–0.7)
Eosinophils Relative: 1 %
HCT: 33.8 % — ABNORMAL LOW (ref 36.0–46.0)
Hemoglobin: 10.2 g/dL — ABNORMAL LOW (ref 12.0–15.0)
IMMATURE GRANULOCYTES: 2 %
Lymphocytes Relative: 14 %
Lymphs Abs: 1.4 10*3/uL (ref 0.7–4.0)
MCH: 27.9 pg (ref 26.0–34.0)
MCHC: 30.2 g/dL (ref 30.0–36.0)
MCV: 92.6 fL (ref 78.0–100.0)
MONOS PCT: 11 %
Monocytes Absolute: 1.1 10*3/uL — ABNORMAL HIGH (ref 0.1–1.0)
NEUTROS PCT: 72 %
Neutro Abs: 7.1 10*3/uL (ref 1.7–7.7)
PLATELETS: ADEQUATE 10*3/uL (ref 150–400)
RBC: 3.65 MIL/uL — ABNORMAL LOW (ref 3.87–5.11)
RDW: 16.2 % — AB (ref 11.5–15.5)
WBC: 10 10*3/uL (ref 4.0–10.5)

## 2018-04-21 LAB — I-STAT CG4 LACTIC ACID, ED
LACTIC ACID, VENOUS: 0.82 mmol/L (ref 0.5–1.9)
Lactic Acid, Venous: 6.5 mmol/L (ref 0.5–1.9)

## 2018-04-21 LAB — COMPREHENSIVE METABOLIC PANEL
ALK PHOS: 40 U/L (ref 38–126)
ALT: 13 U/L (ref 0–44)
ANION GAP: 21 — AB (ref 5–15)
AST: 35 U/L (ref 15–41)
Albumin: 3.3 g/dL — ABNORMAL LOW (ref 3.5–5.0)
BILIRUBIN TOTAL: 2 mg/dL — AB (ref 0.3–1.2)
BUN: 55 mg/dL — ABNORMAL HIGH (ref 8–23)
CALCIUM: 7.6 mg/dL — AB (ref 8.9–10.3)
CO2: 19 mmol/L — AB (ref 22–32)
CREATININE: 2.22 mg/dL — AB (ref 0.44–1.00)
Chloride: 94 mmol/L — ABNORMAL LOW (ref 98–111)
GFR calc Af Amer: 22 mL/min — ABNORMAL LOW (ref >60)
GFR calc non Af Amer: 19 mL/min — ABNORMAL LOW (ref >60)
Glucose, Bld: 133 mg/dL — ABNORMAL HIGH (ref 70–99)
Potassium: 4.8 mmol/L (ref 3.5–5.1)
SODIUM: 134 mmol/L — AB (ref 135–145)
TOTAL PROTEIN: 6.8 g/dL (ref 6.5–8.1)

## 2018-04-21 LAB — BRAIN NATRIURETIC PEPTIDE

## 2018-04-21 LAB — I-STAT TROPONIN, ED: TROPONIN I, POC: 0.08 ng/mL (ref 0.00–0.08)

## 2018-04-21 MED ORDER — ONDANSETRON 4 MG PO TBDP: 4.0000 mg | ORAL_TABLET | Freq: Four times a day (QID) | ORAL | Status: DC | PRN

## 2018-04-21 MED ORDER — MORPHINE SULFATE (PF) 4 MG/ML IV SOLN
4.0000 mg | INTRAVENOUS | Status: DC | PRN
  Administered 2018-04-21 (×2): 4 mg via INTRAVENOUS
  Filled 2018-04-21 (×3): qty 1

## 2018-04-21 MED ORDER — SODIUM CHLORIDE 0.9 % IV SOLN: 250.0000 mL | INTRAVENOUS | Status: DC | PRN

## 2018-04-21 MED ORDER — MORPHINE SULFATE (PF) 4 MG/ML IV SOLN
4.0000 mg | INTRAVENOUS | Status: AC
  Administered 2018-04-21: 4 mg via INTRAVENOUS

## 2018-04-21 MED ORDER — DIPHENHYDRAMINE HCL 50 MG/ML IJ SOLN
12.5000 mg | INTRAMUSCULAR | Status: DC | PRN
  Administered 2018-04-21: 12.5 mg via INTRAVENOUS

## 2018-04-21 MED ORDER — ACETAMINOPHEN 650 MG RE SUPP: 650.0000 mg | Freq: Four times a day (QID) | RECTAL | Status: DC | PRN

## 2018-04-21 MED ORDER — BISACODYL 10 MG RE SUPP: 10.0000 mg | Freq: Every day | RECTAL | Status: DC | PRN

## 2018-04-21 MED ORDER — GLYCOPYRROLATE 0.2 MG/ML IJ SOLN: 0.2000 mg | INTRAMUSCULAR | Status: DC | PRN

## 2018-04-21 MED ORDER — SODIUM CHLORIDE 0.9 % IV SOLN
2.0000 g | Freq: Once | INTRAVENOUS | Status: AC
  Administered 2018-04-21: 2 g via INTRAVENOUS
  Filled 2018-04-21: qty 2

## 2018-04-21 MED ORDER — HALOPERIDOL 1 MG PO TABS
0.5000 mg | ORAL_TABLET | ORAL | Status: DC | PRN
  Filled 2018-04-21: qty 0.5

## 2018-04-21 MED ORDER — ACETAMINOPHEN 325 MG PO TABS: 650.0000 mg | ORAL_TABLET | Freq: Four times a day (QID) | ORAL | Status: DC | PRN

## 2018-04-21 MED ORDER — PANTOPRAZOLE SODIUM 40 MG PO TBEC: 40.0000 mg | DELAYED_RELEASE_TABLET | Freq: Every day | ORAL | Status: DC

## 2018-04-21 MED ORDER — MORPHINE SULFATE (PF) 2 MG/ML IV SOLN
2.0000 mg | INTRAVENOUS | Status: DC | PRN
  Administered 2018-04-21 (×2): 2 mg via INTRAVENOUS
  Filled 2018-04-21 (×2): qty 1

## 2018-04-21 MED ORDER — FUROSEMIDE 20 MG PO TABS: 40.0000 mg | ORAL_TABLET | Freq: Every day | ORAL | Status: DC | PRN

## 2018-04-21 MED ORDER — APIXABAN 5 MG PO TABS: 10.0000 mg | ORAL_TABLET | Freq: Two times a day (BID) | ORAL | Status: DC

## 2018-04-21 MED ORDER — HYPROMELLOSE (GONIOSCOPIC) 2.5 % OP SOLN
1.0000 [drp] | Freq: Two times a day (BID) | OPHTHALMIC | Status: DC | PRN
  Filled 2018-04-21: qty 15

## 2018-04-21 MED ORDER — SODIUM CHLORIDE 0.9 % IV BOLUS (SEPSIS)
1000.0000 mL | Freq: Once | INTRAVENOUS | Status: AC
  Administered 2018-04-21: 1000 mL via INTRAVENOUS

## 2018-04-21 MED ORDER — ONDANSETRON HCL 4 MG/2ML IJ SOLN: 4.0000 mg | Freq: Four times a day (QID) | INTRAMUSCULAR | Status: DC | PRN

## 2018-04-21 MED ORDER — CARVEDILOL 12.5 MG PO TABS: 12.5000 mg | ORAL_TABLET | Freq: Two times a day (BID) | ORAL | Status: DC

## 2018-04-21 MED ORDER — MORPHINE SULFATE (PF) 2 MG/ML IV SOLN
2.0000 mg | INTRAVENOUS | Status: DC | PRN
  Filled 2018-04-21: qty 1

## 2018-04-21 MED ORDER — LORAZEPAM 2 MG/ML IJ SOLN
1.0000 mg | INTRAMUSCULAR | Status: DC | PRN
  Administered 2018-04-21: 1 mg via INTRAVENOUS
  Filled 2018-04-21: qty 1

## 2018-04-21 MED ORDER — SODIUM CHLORIDE 0.9% FLUSH: 3.0000 mL | Freq: Two times a day (BID) | INTRAVENOUS | Status: DC

## 2018-04-21 MED ORDER — HALOPERIDOL LACTATE 5 MG/ML IJ SOLN: 0.5000 mg | INTRAMUSCULAR | Status: DC | PRN

## 2018-04-21 MED ORDER — CINACALCET HCL 30 MG PO TABS: 30.0000 mg | ORAL_TABLET | Freq: Two times a day (BID) | ORAL | Status: DC

## 2018-04-21 MED ORDER — GLYCOPYRROLATE 1 MG PO TABS
1.0000 mg | ORAL_TABLET | ORAL | Status: DC | PRN
  Filled 2018-04-21: qty 1

## 2018-04-21 MED ORDER — LORAZEPAM 2 MG/ML IJ SOLN: 1.0000 mg | INTRAMUSCULAR | Status: DC | PRN

## 2018-04-21 MED ORDER — GLYCERIN-HYPROMELLOSE-PEG 400 0.2-0.2-1 % OP SOLN: 1.0000 [drp] | Freq: Two times a day (BID) | OPHTHALMIC | Status: DC | PRN

## 2018-04-21 MED ORDER — METRONIDAZOLE IN NACL 5-0.79 MG/ML-% IV SOLN
500.0000 mg | Freq: Three times a day (TID) | INTRAVENOUS | Status: DC
  Administered 2018-04-21: 500 mg via INTRAVENOUS
  Filled 2018-04-21: qty 100

## 2018-04-21 MED ORDER — SODIUM CHLORIDE 0.9% FLUSH: 3.0000 mL | INTRAVENOUS | Status: DC | PRN

## 2018-04-21 MED ORDER — LEVOTHYROXINE SODIUM 50 MCG PO TABS: 50.0000 ug | ORAL_TABLET | Freq: Every day | ORAL | Status: DC

## 2018-04-21 MED ORDER — VANCOMYCIN HCL IN DEXTROSE 1-5 GM/200ML-% IV SOLN: 1000.0000 mg | INTRAVENOUS | Status: DC

## 2018-04-21 MED ORDER — SODIUM CHLORIDE 0.9 % IV SOLN: 1.0000 g | INTRAVENOUS | Status: DC

## 2018-04-21 MED ORDER — BIOTENE DRY MOUTH MT LIQD: 15.0000 mL | OROMUCOSAL | Status: DC | PRN

## 2018-04-21 MED ORDER — NITROGLYCERIN 0.4 MG SL SUBL: 0.4000 mg | SUBLINGUAL_TABLET | SUBLINGUAL | Status: DC | PRN

## 2018-04-21 MED ORDER — SODIUM CHLORIDE 0.9 % IV SOLN
12.5000 mg | Freq: Four times a day (QID) | INTRAVENOUS | Status: DC | PRN
  Filled 2018-04-21: qty 0.5

## 2018-04-21 MED ORDER — MORPHINE SULFATE (CONCENTRATE) 10 MG/0.5ML PO SOLN: 5.0000 mg | ORAL | Status: DC | PRN

## 2018-04-21 MED ORDER — DERMACLOUD EX CREA: 1.0000 "application " | TOPICAL_CREAM | Freq: Three times a day (TID) | CUTANEOUS | Status: DC

## 2018-04-21 MED ORDER — LOPERAMIDE HCL 2 MG PO CAPS: 2.0000 mg | ORAL_CAPSULE | Freq: Four times a day (QID) | ORAL | Status: DC | PRN

## 2018-04-21 MED ORDER — ZINC OXIDE 40 % EX OINT
TOPICAL_OINTMENT | Freq: Three times a day (TID) | CUTANEOUS | Status: DC
  Administered 2018-04-21: 18:00:00 via TOPICAL
  Filled 2018-04-21: qty 57

## 2018-04-21 MED ORDER — DIPHENHYDRAMINE HCL 50 MG/ML IJ SOLN
INTRAMUSCULAR | Status: AC
  Filled 2018-04-21: qty 1

## 2018-04-21 MED ORDER — HALOPERIDOL LACTATE 2 MG/ML PO CONC
0.5000 mg | ORAL | Status: DC | PRN
  Filled 2018-04-21: qty 0.3

## 2018-04-21 MED ORDER — VANCOMYCIN HCL IN DEXTROSE 1-5 GM/200ML-% IV SOLN: 1000.0000 mg | Freq: Once | INTRAVENOUS | Status: DC

## 2018-04-21 MED ORDER — POLYVINYL ALCOHOL 1.4 % OP SOLN
1.0000 [drp] | Freq: Four times a day (QID) | OPHTHALMIC | Status: DC | PRN
  Filled 2018-04-21: qty 15

## 2018-04-21 MED ORDER — MORPHINE SULFATE (PF) 2 MG/ML IV SOLN
1.0000 mg | INTRAVENOUS | Status: DC
  Administered 2018-04-21: 1 mg via INTRAVENOUS

## 2018-04-21 MED ORDER — VANCOMYCIN HCL 10 G IV SOLR
1500.0000 mg | Freq: Once | INTRAVENOUS | Status: AC
  Administered 2018-04-21: 1500 mg via INTRAVENOUS
  Filled 2018-04-21: qty 1500

## 2018-04-21 MED ORDER — ACETAMINOPHEN 500 MG PO TABS: 500.0000 mg | ORAL_TABLET | Freq: Three times a day (TID) | ORAL | Status: DC | PRN

## 2018-04-21 MED ORDER — SCOPOLAMINE 1 MG/3DAYS TD PT72: 1.0000 | MEDICATED_PATCH | TRANSDERMAL | Status: DC | PRN

## 2018-04-22 LAB — URINE CULTURE: CULTURE: NO GROWTH

## 2018-04-26 LAB — CULTURE, BLOOD (ROUTINE X 2)
Culture: NO GROWTH
Culture: NO GROWTH

## 2018-05-09 NOTE — Care Management (Signed)
Per Dr Antionette Char: "Carryover. 55 yof with hx of dementia, HTN, CVA, CKD III, systolic CHF, IDDM, just discharged after admission with AMS, DVT, and suspected PE, started on Eliquis. EMS was called for somnolence and AMS, they had difficulty getting a BP initially, gave IVF. Initial lactate 6, UA and CXR not suggestive of infection. Has AKI. Treated with fluid bolus and empiric abx. Lactate and BP have normalized, work of breathing better, not exactly clear what happened."  Chart reviewed by me revealed an ejection fraction 15% with pulmonary artery systolic pressure of 68 recent bilateral pulmonary emboli.  Patient was doing fairly well until 2 weeks ago when she started having significant troubles.  Patient clearly is at the end of her life.  I had a discussion with the patient's 2 daughters at the bedside who concerned about her comfort at the end of her life.  Mom to be comfortable.  Discussed admission to palliative care with transitioning to hospice care once her symptoms are treated.  It is a full DNR comfort care only.  Will consult palliative care for assistance with symptom management until transfer to hospice can be arranged.  Family is interested in patient returning to [facility] health care for remainder of care.

## 2018-05-09 NOTE — ED Notes (Signed)
Admitting at bedside 

## 2018-05-09 NOTE — ED Provider Notes (Signed)
[facility] EMERGENCY DEPARTMENT Provider Note   CSN: [id number] Arrival date & time: 24-Apr-2018  0102     History   Chief Complaint Chief Complaint  Patient presents with  . Hypotension    HPI Tina Waters is a 84 y.o. female.  Patient sent to the emergency department from nursing home.  Patient just discharged back to the nursing home this afternoon after being admitted to the hospital for metabolic encephalopathy and acute pulmonary embolism.  Nursing staff noted upon arrival to the nursing facility that the patient appeared to be short of breath.  They then were unable to obtain a blood pressure, called EMS for transport.  EMS report the patient was hypotensive they could not obtain peripheral pulses initially.  Upon arrival to the ER, patient is alert.  She reports that she is in no distress without any difficulty breathing or shortness of breath.  She is, however, somnolent and it is not clear if she is answering questions appropriately. Level V Caveat due to aduity/likely confusion.     Past Medical History:  Diagnosis Date  . CHF (congestive heart failure) (HCC)   . Hypertension   . Stroke (HCC)   . Thyroid disease     Patient Active Problem List   Diagnosis Date Noted  . Pulmonary embolism (HCC) 04/19/2018  . DVT, lower extremity, proximal, acute (HCC) 04/19/2018  . CKD (chronic kidney disease) stage 3, GFR 30-59 ml/min (HCC) 04/18/2018  . Transient speech disturbance 04/09/2018  . Acute on chronic systolic CHF (congestive heart failure) (HCC) 04/08/2018  . Hypothyroidism 04/08/2018  . Insulin-requiring or dependent type II diabetes mellitus (HCC) 04/08/2018  . AKI (acute kidney injury) (HCC) 04/08/2018  . Acute encephalopathy 04/08/2018  . History of CVA with residual deficit 04/08/2018  . Elevated troponin 04/08/2018  . Hyperkalemia 04/08/2018  . Elevated lactic acid level 04/08/2018    History reviewed. No pertinent surgical history.   OB  History   None      Home Medications    Prior to Admission medications   Medication Sig Start Date End Date Taking? Authorizing Provider  acetaminophen (TYLENOL) 500 MG tablet Take 500 mg by mouth every 8 (eight) hours as needed for moderate pain.    [provider]  apixaban (ELIQUIS) 5 MG TABS tablet Take 2 tablets (10 mg total) by mouth 2 (two) times daily. 04/20/18 05/20/18  Arrien, York Ram, MD  atorvastatin (LIPITOR) 20 MG tablet Take 20 mg by mouth at bedtime.    [provider]  carvedilol (COREG) 12.5 MG tablet Take 12.5 mg by mouth 2 (two) times daily.    [provider]  cholecalciferol (VITAMIN D) 1000 units tablet Take 1,000 Units by mouth daily.    [provider]  cinacalcet (SENSIPAR) 30 MG tablet Take 30 mg by mouth 2 (two) times daily.    [provider]  furosemide (LASIX) 40 MG tablet Take once daily as needed for shortness of breath or weight gain three lbs in 24 hours or five lbs in 7 days. 04/20/18   Arrien, York Ram, MD  Glycerin-Hypromellose-PEG 400 (ARTIFICIAL TEARS) 0.2-0.2-1 % SOLN Place 1 drop into both eyes every 12 (twelve) hours as needed (for dry eyes).     [provider]  Infant Care Products (DERMACLOUD) CREA Apply 1 application topically 3 (three) times daily. Apply to sacrum and buttocks topically every shift to protect skin from breakdown    [provider]  insulin aspart (NOVOLOG) 100 UNIT/ML  injection Inject 0-10 Units into the skin See admin instructions. Inject 0-10 units into the skin three times a day before meals and at bedtime, per sliding scale: BGL 0-150 = 0 unit; 151-200 = 2 units; 201-250 = 4 units; 251-300 = 6 units; 301-350 = 8 units; 351-400 = 10 units; >400 = CALL MD    [provider]  levothyroxine (SYNTHROID, LEVOTHROID) 50 MCG tablet Take 50 mcg by mouth daily before breakfast.    [provider]  loperamide (IMODIUM A-D) 2 MG tablet Take 2 mg  by mouth every 6 (six) hours as needed (for diarrhea).     [provider]  nitroGLYCERIN (NITROSTAT) 0.4 MG SL tablet Place 0.4 mg under the tongue every 5 (five) minutes x 3 doses as needed (for chest pain and CALL MD IF NO RELIEF).     [provider]  pantoprazole (PROTONIX) 40 MG tablet Take 40 mg by mouth daily.    [provider]    Family History Family History  Problem Relation Age of Onset  . Obesity Daughter     Social History Social History   Tobacco Use  . Smoking status: Never Smoker  . Smokeless tobacco: Never Used  Substance Use Topics  . Alcohol use: No    Frequency: Never  . Drug use: No     Allergies   Penicillins   Review of Systems Review of Systems  Unable to perform ROS: Acuity of condition     Physical Exam Updated Vital Signs BP 132/69   Pulse 70   Temp 97.9 F (36.6 C) (Rectal)   Resp (!) 27   Ht 5\' 2"  (1.575 m) Comment: 04/18/18  Wt 86.2 kg   SpO2 99%   BMI 34.76 kg/m   Physical Exam  Constitutional: She is oriented to person, place, and time. She appears well-developed and well-nourished. No distress.  HENT:  Head: Normocephalic and atraumatic.  Right Ear: Hearing normal.  Left Ear: Hearing normal.  Nose: Nose normal.  Mouth/Throat: Oropharynx is clear and moist and mucous membranes are normal.  Eyes: Pupils are equal, round, and reactive to light. Conjunctivae and EOM are normal.  Neck: Normal range of motion. Neck supple.  Cardiovascular: Regular rhythm, S1 normal and S2 normal. Exam reveals no gallop and no friction rub.  No murmur heard. Pulmonary/Chest: Accessory muscle usage present. Tachypnea noted. She has decreased breath sounds. She exhibits no tenderness.  Abdominal: Soft. Normal appearance and bowel sounds are normal. There is no hepatosplenomegaly. There is no tenderness. There is no rebound, no guarding, no tenderness at McBurney's point and negative Murphy's sign. No hernia.    Musculoskeletal: Normal range of motion. She exhibits edema.  Neurological: She is alert and oriented to person, place, and time. She has normal strength. No cranial nerve deficit or sensory deficit. Coordination normal. GCS eye subscore is 4. GCS verbal subscore is 5. GCS motor subscore is 6.  Skin: Skin is warm, dry and intact. No rash noted. No cyanosis.  Psychiatric: She has a normal mood and affect. Her speech is normal and behavior is normal. Thought content normal.  Nursing note and vitals reviewed.    ED Treatments / Results  Labs (all labs ordered are listed, but only abnormal results are displayed) Labs Reviewed  COMPREHENSIVE METABOLIC PANEL - Abnormal; Notable for the following components:      Result Value   Sodium 134 (*)    Chloride 94 (*)    CO2 19 (*)  Glucose, Bld 133 (*)    BUN 55 (*)    Creatinine, Ser 2.22 (*)    Calcium 7.6 (*)    Albumin 3.3 (*)    Total Bilirubin 2.0 (*)    GFR calc non Af Amer 19 (*)    GFR calc Af Amer 22 (*)    Anion gap 21 (*)    All other components within normal limits  CBC WITH DIFFERENTIAL/PLATELET - Abnormal; Notable for the following components:   RBC 3.65 (*)    Hemoglobin 10.2 (*)    HCT 33.8 (*)    RDW 16.2 (*)    Monocytes Absolute 1.1 (*)    Abs Immature Granulocytes 0.2 (*)    All other components within normal limits  URINALYSIS, ROUTINE W REFLEX MICROSCOPIC - Abnormal; Notable for the following components:   Color, Urine AMBER (*)    APPearance HAZY (*)    Protein, ur 30 (*)    Bacteria, UA RARE (*)    All other components within normal limits  BRAIN NATRIURETIC PEPTIDE - Abnormal; Notable for the following components:   B Natriuretic Peptide >4,500.0 (*)    All other components within normal limits  I-STAT CG4 LACTIC ACID, ED - Abnormal; Notable for the following components:   Lactic Acid, Venous 6.50 (*)    All other components within normal limits  CULTURE, BLOOD (ROUTINE X 2)  CULTURE, BLOOD (ROUTINE X  2)  URINE CULTURE  I-STAT TROPONIN, ED  I-STAT CG4 LACTIC ACID, ED    EKG EKG Interpretation  Date/Time:  Friday 05/18/2018 05:04:26 EDT Ventricular Rate:  70 PR Interval:    QRS Duration: 141 QT Interval:  495 QTC Calculation: 535 R Axis:   -69 Text Interpretation:  Atrial-paced rhythm RBBB and LAFB Confirmed by Gilda Crease ([id number]) on 18-May-2018 5:12:27 AM   Radiology Dg Chest Port 1 View  Result Date: 2018-05-18 CLINICAL DATA:  84 y/o  F; shortness of breath. EXAM: PORTABLE CHEST 1 VIEW COMPARISON:  04/17/2018 chest radiograph.  01/10/2018 CT chest. FINDINGS: Stable cardiomegaly given projection and technique. 2 lead pacemaker. Aortic atherosclerosis with calcification. Persistent opacification of the left lung base likely related to hiatal hernia, effusion, and/or atelectasis. No pleural effusion or pneumothorax. No acute osseous abnormality is evident. IMPRESSION: 1. Stable left basilar opacity likely related to hiatal hernia, effusion, and/or atelectasis. 2. Stable cardiomegaly. 3.  Aortic Atherosclerosis (ICD10-I70.0). Electronically Signed   By: Mitzi Hansen M.D.   On: 05/18/18 02:42    Procedures Procedures (including critical care time)  Medications Ordered in ED Medications  metroNIDAZOLE (FLAGYL) IVPB 500 mg (0 mg Intravenous Stopped 2018/05/18 0524)  vancomycin (VANCOCIN) 1,500 mg in sodium chloride 0.9 % 500 mL IVPB (1,500 mg Intravenous New Bag/Given May 18, 2018 0525)  vancomycin (VANCOCIN) IVPB 1000 mg/200 mL premix (has no administration in time range)  ceFEPIme (MAXIPIME) 1 g in sodium chloride 0.9 % 100 mL IVPB (has no administration in time range)  ceFEPIme (MAXIPIME) 2 g in sodium chloride 0.9 % 100 mL IVPB (0 g Intravenous Stopped 05/18/18 0438)  sodium chloride 0.9 % bolus 1,000 mL (0 mLs Intravenous Stopped 05/18/2018 0526)    And  sodium chloride 0.9 % bolus 1,000 mL (1,000 mLs Intravenous New Bag/Given 18-May-2018 0416)    And  sodium  chloride 0.9 % bolus 1,000 mL (0 mLs Intravenous Stopped 05-18-18 0526)     Initial Impression / Assessment and Plan / ED Course  I have reviewed the triage vital signs  and the nursing notes.  Pertinent labs & imaging results that were available during my care of the patient were reviewed by me and considered in my medical decision making (see chart for details).     Patient transported to the emergency department from skilled nursing facility.  Patient just discharged from this hospital this afternoon after being treated in the hospital for metabolic encephalopathy and acute PE.  Patient is on Eliquis at discharge.  Patient noted to be somnolent and altered by family and nursing staff in skilled nursing facility.  She continues to be altered at arrival to the ER.  Patient was hypotensive prehospital.  She was given some fluid by EMS and improved, hydration was continued here in the ER.  Sepsis was considered.  Her initial lactic acid was 6.5.  At this point it was felt that she likely was septic and she was administered broad-spectrum antibiotics and a fluid bolus.  She appeared tachypneic at arrival, chest x-ray is clear.  She has an acute kidney injury, could not undergo repeat CT scan to further evaluate her known PE.  No source of infection has been identified.  No obvious pneumonia, no urinary tract infection.  Repeat lactic acid is now less than 1, unclear if initial lactate was accurate.  Initially it was planned to give her a full 30 mL/kg IV bolus because of the significantly elevated lactic acid.  When her second lactic acid was normal after a liter bolus, remainder of IV fluids were discontinued.  CRITICAL CARE Performed by: Gilda Crease   Total critical care time: 30 minutes  Critical care time was exclusive of separately billable procedures and treating other patients.  Critical care was necessary to treat or prevent imminent or life-threatening deterioration.  Critical  care was time spent personally by me on the following activities: development of treatment plan with patient and/or surrogate as well as nursing, discussions with consultants, evaluation of patient's response to treatment, examination of patient, obtaining history from patient or surrogate, ordering and performing treatments and interventions, ordering and review of laboratory studies, ordering and review of radiographic studies, pulse oximetry and re-evaluation of patient's condition.   Final Clinical Impressions(s) / ED Diagnoses   Final diagnoses:  Sepsis, due to unspecified organism [facility])  AKI (acute kidney injury) [facility])    ED Discharge Orders    None       Gilda Crease, MD May 04, 2018 [phone]

## 2018-05-09 NOTE — Consult Note (Signed)
Consultation Note Date: 05/03/18   Patient Name: Tina Waters  DOB: 07-Dec-1927  MRN: [id number]  Age / Sex: 84 y.o., female  PCP: Eloisa Northern, MD Referring Physician: Lahoma Crocker, MD  Reason for Consultation: Establishing goals of care, Hospice Evaluation, Non pain symptom management, Pain control, Psychosocial/spiritual support and Terminal Care  HPI/Patient Profile: 84 y.o. female  with past medical history of dementia, hypertension, CVA, chronic kidney disease stage III, systolic heart failure with an ejection fraction of 15%, severe pulmonary hypertension, diabetes, recent hospitalization and treatment for DVT and PE admitted on May 03, 2018 with altered mental status.   Initially was unable to get a blood pressure and received fluid bolus.  Upon presentation, her lactic acid was 6.  UA and chest x-ray were not convincing for an infection.  After antibiotics and fluid patient's numbers appeared better however she herself appears to be transitioning towards end-of-life.  Consult ordered for goals of care, comfort care as well as symptom management and hospice evaluation.   Clinical Assessment and Goals of Care: Chart reviewed, patient seen.  Daughters [employer] as well as Charlann Lange, granddaughter Tiffany, nephew Thayer Ohm, are at the bedside.  They appear to recognize how ill she is and that she is nearing end of life.  They are still struggling with how quickly she has declined as well as recently being hospitalized and discharged without family being notified  Multiple family members are arriving from the [place] area as well as MD this evening.  Engaged in brief life review.  Patient is from [facility] and moved to [place] and then subsequently [place].  She had 8 children, the first of which died.  She raised other 7 children and worked as a Glass blower/designer.  She was recently  moved back to [facility] because of her worsening health.  Prior to this, she had been living with 1 of her sons in his home in [place]  We did talk a lot about the disease progression of dementia in addition to patient's multisystem organ frailty.  Patient at this point is unable to speak for herself.  She does have 8 living children.  I am not clear at this point who is a designated healthcare proxy if she has one.  Multiple family members are arriving this evening and they appear to be making decisions cohesively, and as a family.  Daughters Hart Rochester and Gavin Pound are the primary spokesperson's and are opting for comfort care.  We did discuss hospital death versus residential hospice.  Family is aware of [facility].  It is their desire that she not die in a nursing home , where she has only a semiprivate room    SUMMARY OF RECOMMENDATIONS   DNR DNI Comfort care Discontinue oral medications as patient is unable to take anything by mouth Palliative medicine to meet with other family members on 04/22/2018 at 10 AM to further discuss clinical status as well as disposition options such as [facility]; will evaluate at that time whether  she is stable for transport, bed availability Code Status/Advance Care Planning:  DNR    Symptom Management:   Dyspnea: We will start scheduled morphine at 1 mg every 4 hours.  Will offer a range for breakthrough dyspnea, 2 to 4 mg every 2 hours as needed  Pain: No nonverbal signs and symptoms of pain however will rely on opioids should pain become an issue  Secretions: Start scheduled Robinul; continue as needed Robinul  DC oral medications as patient is unable to swallow   Palliative Prophylaxis:   Aspiration, Bowel Regimen, Delirium Protocol, Eye Care, Frequent Pain Assessment, Oral Care and Turn Reposition  Additional Recommendations (Limitations, Scope, Preferences):  Full Comfort Care  Psycho-social/Spiritual:   Desire for  further Chaplaincy support:no  Additional Recommendations: Referral to Community Resources   Prognosis:   Hours to days potentially in the setting of advanced dementia, recent bilateral PE/DVT, systolic heart failure with an ejection fraction 15%, chronic kidney disease stage III, CVA; multisystem organ failure.  Patient is symptomatic, with tachypnea which is responding well to morphine IV  Discharge Planning: TBD. Hospital death vs residential hospice      Primary Diagnoses: Present on Admission: . Pulmonary embolism (HCC) . DVT, lower extremity, proximal, acute (HCC) . Acute renal failure superimposed on stage 3 chronic kidney disease (HCC) . Acute on chronic systolic CHF (congestive heart failure), NYHA class 4 (HCC) . Hypothyroidism   I have reviewed the medical record, interviewed the patient and family, and examined the patient. The following aspects are pertinent.  Past Medical History:  Diagnosis Date  . CHF (congestive heart failure) (HCC)   . Hypertension   . Stroke (HCC)   . Thyroid disease    Social History   Socioeconomic History  . Marital status: Widowed    Spouse name: Not on file  . Number of children: Not on file  . Years of education: Not on file  . Highest education level: Not on file  Occupational History  . Not on file  Social Needs  . Financial resource strain: Not on file  . Food insecurity:    Worry: Not on file    Inability: Not on file  . Transportation needs:    Medical: Not on file    Non-medical: Not on file  Tobacco Use  . Smoking status: Never Smoker  . Smokeless tobacco: Never Used  Substance and Sexual Activity  . Alcohol use: No    Frequency: Never  . Drug use: No  . Sexual activity: Not on file  Lifestyle  . Physical activity:    Days per week: Not on file    Minutes per session: Not on file  . Stress: Not on file  Relationships  . Social connections:    Talks on phone: Not on file    Gets together: Not on file     Attends religious service: Not on file    Active member of club or organization: Not on file    Attends meetings of clubs or organizations: Not on file    Relationship status: Not on file  Other Topics Concern  . Not on file  Social History Narrative  . Not on file   Family History  Problem Relation Age of Onset  . Obesity Daughter    Scheduled Meds: . carvedilol  12.5 mg Oral BID  . cinacalcet  30 mg Oral BID WC  . diphenhydrAMINE      . levothyroxine  50 mcg Oral QAC breakfast  . liver  oil-zinc oxide   Topical TID  . pantoprazole  40 mg Oral Daily  . sodium chloride flush  3 mL Intravenous Q12H   Continuous Infusions: . sodium chloride    . chlorproMAZINE (THORAZINE) IV     PRN Meds:.sodium chloride, acetaminophen **OR** acetaminophen, antiseptic oral rinse, bisacodyl, chlorproMAZINE (THORAZINE) IV, diphenhydrAMINE, furosemide, glycopyrrolate **OR** glycopyrrolate **OR** glycopyrrolate, haloperidol **OR** haloperidol **OR** haloperidol lactate, hydroxypropyl methylcellulose / hypromellose, loperamide, LORazepam, morphine injection, morphine CONCENTRATE **OR** morphine CONCENTRATE, nitroGLYCERIN, ondansetron **OR** ondansetron (ZOFRAN) IV, polyvinyl alcohol, scopolamine, sodium chloride flush Medications Prior to Admission:  Prior to Admission medications   Medication Sig Start Date End Date Taking? Authorizing Provider  acetaminophen (TYLENOL) 500 MG tablet Take 500 mg by mouth every 8 (eight) hours as needed (for pain).    Yes [provider]  apixaban (ELIQUIS) 5 MG TABS tablet Take 10 mg by mouth 2 (two) times daily. Patient was to take Apixaban 10mg  po BID x7 days then step down to 5mg  po BID on 04/28/18. 04/20/18 04/27/18 Yes [provider]  apixaban (ELIQUIS) 5 MG TABS tablet Take 5 mg by mouth 2 (two) times daily. 04/28/18  Yes [provider]  atorvastatin (LIPITOR) 20 MG tablet Take 20 mg by mouth at bedtime.   Yes [provider]    carvedilol (COREG) 12.5 MG tablet Take 12.5 mg by mouth 2 (two) times daily.   Yes [provider]  cholecalciferol (VITAMIN D) 1000 units tablet Take 1,000 Units by mouth daily.   Yes [provider]  cinacalcet (SENSIPAR) 30 MG tablet Take 30 mg by mouth 2 (two) times daily.   Yes [provider]  furosemide (LASIX) 40 MG tablet Take once daily as needed for shortness of breath or weight gain three lbs in 24 hours or five lbs in 7 days. Patient taking differently: Take 40 mg by mouth daily as needed (for shortness of breath or weight gain of 3 lbs/24 hours or 5 lbs/7 days).  04/20/18  Yes Arrien, York Ram, MD  Glycerin-Hypromellose-PEG 400 (ARTIFICIAL TEARS) 0.2-0.2-1 % SOLN Place 1 drop into both eyes every 12 (twelve) hours as needed (for dry eyes).    Yes [provider]  insulin aspart (NOVOLOG) 100 UNIT/ML injection Inject 0-10 Units into the skin See admin instructions. Inject 0-10 units into the skin three times a day before meals and at bedtime, per sliding scale: BGL 0-150 = 0 unit; 151-200 = 2 units; 201-250 = 4 units; 251-300 = 6 units; 301-350 = 8 units; 351-400 = 10 units; >400 = CALL MD   Yes [provider]  levothyroxine (SYNTHROID, LEVOTHROID) 50 MCG tablet Take 50 mcg by mouth daily before breakfast.   Yes [provider]  loperamide (IMODIUM A-D) 2 MG tablet Take 2 mg by mouth every 6 (six) hours as needed (for diarrhea).    Yes [provider]  nitroGLYCERIN (NITROSTAT) 0.4 MG SL tablet Place 0.4 mg under the tongue every 5 (five) minutes x 3 doses as needed (for chest pain and CALL MD IF NO RELIEF).    Yes [provider]  pantoprazole (PROTONIX) 40 MG tablet Take 40 mg by mouth daily.   Yes [provider]  Infant Care Products (DERMACLOUD) CREA Apply 1 application topically 3 (three) times daily. Apply to sacrum and buttocks topically every shift to protect skin from breakdown    [provider]   Allergies  Allergen Reactions  . Penicillins Other (See Comments)    Has  patient had a PCN reaction causing immediate rash, facial/tongue/throat swelling, SOB or lightheadedness with hypotension: Yes Has patient had a PCN reaction causing severe rash involving mucus membranes or skin necrosis: No Has patient had a PCN reaction that required hospitalization: No Has patient had a PCN reaction occurring within the last 10 years: No If all of the above answers are "NO", then may proceed with Cephalosporin use.   Review of Systems  Unable to perform ROS: Acuity of condition    Physical Exam  Constitutional: She appears well-developed and well-nourished.  Acutely ill-appearing elderly female; unresponsive to voice and light touch Appears to be transitioning towards end-of-life  Cardiovascular:  Generalized edema  Pulmonary/Chest:  Respirations are snoring quality but no stridor Resp rate 26/min No upper airway congestion noted  Abdominal: Soft.  Skin: Skin is warm and dry.  Psychiatric:  No overt agitation otherwise unable to test  Nursing note and vitals reviewed.   Vital Signs: BP 122/79 (BP Location: Left Leg)   Pulse 70   Temp 97.6 F (36.4 C) (Oral)   Resp 16   Ht 5\' 2"  (1.575 m) Comment: 04/18/18  Wt 86.2 kg   SpO2 100%   BMI 34.76 kg/m  Pain Scale: PAINAD   Pain Score: Asleep   SpO2: SpO2: 100 % O2 Device:SpO2: 100 % O2 Flow Rate: .O2 Flow Rate (L/min): 2 L/min  IO: Intake/output summary:   Intake/Output Summary (Last 24 hours) at 2018/05/13 1702 Last data filed at 05-13-2018 0846 Gross per 24 hour  Intake 2450 ml  Output -  Net 2450 ml    LBM:   Baseline Weight: Weight: 86.2 kg Most recent weight: Weight: 86.2 kg     Palliative Assessment/Data:   Flowsheet Rows     Most Recent Value  Intake Tab  Referral Department  Hospitalist  Unit at Time of Referral  Med/Surg Unit  Palliative Care Primary Diagnosis  Sepsis/Infectious Disease    Date Notified  2018-05-13  Palliative Care Type  New Palliative care  Reason for referral  Clarify Goals of Care, Non-pain Symptom, Pain, End of Life Care Assistance, Psychosocial or Spiritual support, Counsel Regarding Hospice  Date of Admission  2018/05/13  Date first seen by Palliative Care  2018-05-13  # of days Palliative referral response time  0 Day(s)  # of days IP prior to Palliative referral  0  Clinical Assessment  Palliative Performance Scale Score  30%  Pain Max last 24 hours  Not able to report  Pain Min Last 24 hours  Not able to report  Dyspnea Max Last 24 Hours  Not able to report  Dyspnea Min Last 24 hours  Not able to report  Nausea Max Last 24 Hours  Not able to report  Nausea Min Last 24 Hours  Not able to report  Anxiety Max Last 24 Hours  Not able to report  Anxiety Min Last 24 Hours  Not able to report  Other Max Last 24 Hours  Not able to report  Psychosocial & Spiritual Assessment  Palliative Care Outcomes  Patient/Family meeting held?  Yes  Who was at the meeting?  pt, dtr Sabino Dick, granddaughter Tiffany and nephew Thayer Ohm  Palliative Care Outcomes  Improved pain interventions, Clarified goals of care, Provided end of life care assistance, Provided psychosocial or spiritual support, Counseled regarding hospice, Improved non-pain symptom therapy  Patient/Family wishes: Interventions discontinued/not started   Mechanical Ventilation, BiPAP  Palliative Care follow-up planned  Yes, Facility  Time In: 1600 Time Out: 1710 Time Total: 70 min Greater than 50%  of this time was spent counseling and coordinating care related to the above assessment and plan.  Signed by: Irean Hong, NP   Please contact Palliative Medicine Team phone at [phone] for questions and concerns.  For individual provider: See Loretha Stapler

## 2018-05-09 NOTE — ED Notes (Addendum)
Granddaughter Rodney Booze: [phone]

## 2018-05-09 NOTE — Social Work (Signed)
Acknowledging consult that pt is from [facility]. Following for disposition.  Doy Hutching, LCSWA [facility] Clinical Social Work [phone]

## 2018-05-09 NOTE — ED Triage Notes (Signed)
Pt BIB [facility] for eval of hypotension at facility. Pt was reportedly discharged today for CHF exacerbation, and per facility could not obtain BP automatically/manually. EMS was also unable to do so. On arrival pt is extremely lethargic, somnolent and cool to touch. Pt w/ no radial pulses, able to obtain brachial.

## 2018-05-09 NOTE — Progress Notes (Signed)
Patient is not alert enough to take PO medications.

## 2018-05-09 NOTE — Progress Notes (Addendum)
Wasted 1mg  Morphine with Kenyon Ana, RN.

## 2018-05-09 NOTE — Discharge Summary (Signed)
Death Summary  Tina Waters HEN:[id number] DOB: 1927/10/15 DOA: 04/23/18  PCP: Tina Northern, MD  Admit date: 04-23-2018 Date of Death: 04-24-18 Time of Death: 11-30-2104 Notification: Tina Northern, MD notified of death of 04/24/2018   History of present illness:  Tina Waters is a 84 y.o. female with a history of severe cardiomyopathy with ejection fraction of 15% Tina Waters presented with complaint of unresponsiveness and hypotension Tina Waters did not improve after given IV fluids and IV antibiotics in the emergency department review of her medical record shows that she had an ejection fraction of 15% with pulmonary artery systolic pressure 68 and recent bilateral pulmonary emboli.  Prior to her admission to the hospital 2 weeks ago she had been doing fairly well she had a significant decline after her pulmonary embolism.  She was discharged back to the skilled nursing facility on the day prior to admission but was doing well on the morning after and noted to be hypotensive.  When EMS arrived they had difficulty obtaining a blood pressure and gave her some IV fluids.  Tina Waters was 8.  Severe acute kidney injury.  Family was interested in comfort care given the patient's poor clinical condition.  He was admitted to palliative care with plans for hospice.  Patient expired at 30-Nov-2104 on 2018/04/23  Final Diagnoses:  1.   Cardiogenic shock due to acute on chronic systolic congestive heart failure New [place] Heart Association class IV to class V 2.   Pulmonary embolism. 3.  Acute renal failure superimposed on stage III chronic kidney disease. R.  Type 2 diabetes mellitus requiring insulin.  The results of significant diagnostics from this hospitalization (including imaging, microbiology, ancillary and laboratory) are listed below for reference.    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 At baseline pt able to care for herself with assistance. At this time, pt answering questions appropriately EXAM: CHEST - 2 VIEW COMPARISON:  none FINDINGS: Low lung volumes. Suspect central pulmonary vascular congestion. Consolidation/atelectasis in the lung bases, left greater than right. Cardiomegaly. Left subclavian transvenous pacemaker. Aortic Atherosclerosis (ICD10-170.0). Moderate left and smaller right pleural effusions.  No pneumothorax. DJD in the left shoulder.  Hiatal hernia. IMPRESSION: 1. Cardiomegaly, pleural effusions, and central pulmonary vascularity suggesting CHF. 2. Bibasilar consolidation/atelectasis, left greater than right. Electronically Signed   By: Corlis Leak M.D.   On: 04/08/2018 22:07   Ct Head Wo Contrast  Result Date: 04/18/2018 CLINICAL DATA:  Altered mental status. EXAM: CT HEAD WITHOUT CONTRAST TECHNIQUE: Contiguous axial images were obtained from the base of the skull through the vertex without intravenous contrast. COMPARISON:  Head CT 04/10/2018 and 04/08/2018 FINDINGS: Brain: Unchanged left basal ganglia/subinsular subacute or chronic lacunar infarct from prior exam. No hemorrhagic transformation. No evidence of new ischemia or hemorrhage elsewhere. No subdural or extra-axial fluid collection. Unchanged degree of atrophy and chronic small vessel ischemia. No hydrocephalus. Vascular: No hyperdense vessel. Atherosclerosis of skull base vasculature. Skull: No focal lesion or fracture. Diffuse calvarial heterogeneity likely osteopenia/osteoporosis. Sinuses/Orbits: Prior cataract resection.  No acute findings. Other: None. IMPRESSION: 1.  No acute intracranial abnormality. 2. Unchanged subacute or chronic small left basal ganglia/subinsular lacunar infarct since prior exam. Unchanged atrophy and chronic small vessel ischemia. Electronically Signed   By: Narda Rutherford M.D.   On: 04/18/2018 05:18  Ct  Head Wo Contrast  Result Date: 04/10/2018 CLINICAL DATA:  85 year old female with new left basal ganglia infarct. Follow-up CT scan prior to discharge. EXAM: CT HEAD WITHOUT CONTRAST TECHNIQUE: Contiguous axial images were obtained from the base of the skull through the vertex without intravenous contrast. COMPARISON:  Prior head CT 04/08/2018 FINDINGS: Brain: No evidence of hemorrhagic transformation. Expected evolution of ill-defined low-attenuation in the inferior left basal ganglia and subinsular cortex. Stable cortical atrophy and chronic microvascular ischemic white matter disease. Vascular: No hyperdense vessel or unexpected calcification. Bilateral cavernous and supraclinoid carotid artery atherosclerotic calcifications. Skull: Normal. Negative for fracture or focal lesion. Sinuses/Orbits: No acute finding. Other: None. IMPRESSION: 1. No new acute intracranial abnormality or evidence of hemorrhagic conversion. 2. Expected evolution of left inferior basal ganglia and subinsular cortical infarct. Electronically Signed   By: Malachy Moan M.D.   On: 04/10/2018 12:12   Ct Head Wo Contrast  Result Date: 04/08/2018 CLINICAL DATA:  Patient with decreased level of consciousness. EXAM: CT HEAD WITHOUT CONTRAST TECHNIQUE: Contiguous axial images were obtained from the base of the skull through the vertex without intravenous contrast. COMPARISON:  None. FINDINGS: Brain: Patchy hypodensity within the left basal ganglia (image 15; series 3). Ventricles and sulci are prominent compatible with atrophy. No evidence for intracranial hemorrhage, mass lesion or mass-effect. Vascular: Internal carotid arterial vascular calcifications. Skull: Intact. Sinuses/Orbits: Paranasal sinuses are well aerated. Mastoid air cells are unremarkable. Orbits unremarkable. Other: None. IMPRESSION: 1. Hypodensity within the left basal ganglia favored to represent subacute to chronic left basal ganglia infarct. Electronically Signed    By: Annia Belt M.D.   On: 04/08/2018 22:40   Nm Pulmonary Vent And Perf (v/q Scan)  Result Date: 04/18/2018 CLINICAL DATA:  84 year old presenting with acute chest pain and elevated D-dimer. Stage 3 chronic kidney disease which precludes IV contrast for CTA chest. EXAM: NUCLEAR MEDICINE VENTILATION - PERFUSION LUNG SCAN TECHNIQUE: Ventilation images were obtained in multiple projections using inhaled aerosol Tc-87m DTPA. Perfusion images were obtained in multiple projections after intravenous injection of Tc-40m-MAA. RADIOPHARMACEUTICALS:  31 mCi of Tc-51m DTPA aerosol inhalation and 4.25 mCi Tc89m-MAA IV. COMPARISON:  No prior nuclear imaging. Portable chest x-ray yesterday is correlated. FINDINGS: Ventilation: Large multi-segment ventilatory defects involving the LEFT UPPER LOBE and LEFT LOWER LOBE. Segmental ventilatory defect involving the POSTERIOR RIGHT UPPER LOBE. Perfusion: Matched perfusion defects in both lungs. No mismatched perfusion defects. IMPRESSION: Intermediate probability of pulmonary embolism based on PIOPED II criteria. Electronically Signed   By: Hulan Saas M.D.   On: 04/18/2018 11:25   Dg Chest Port 1 View  Result Date: 05/21/18 CLINICAL DATA:  84 y/o  F; shortness of breath. EXAM: PORTABLE CHEST 1 VIEW COMPARISON:  04/17/2018 chest radiograph.  01/10/2018 CT chest. FINDINGS: Stable cardiomegaly given projection and technique. 2 lead pacemaker. Aortic atherosclerosis with calcification. Persistent opacification of the left lung base likely related to hiatal hernia, effusion, and/or atelectasis. No pleural effusion or pneumothorax. No acute osseous abnormality is evident. IMPRESSION: 1. Stable left basilar opacity likely related to hiatal hernia, effusion, and/or atelectasis. 2. Stable cardiomegaly. 3.  Aortic Atherosclerosis (ICD10-I70.0). Electronically Signed   By: Mitzi Hansen M.D.   On: May 21, 2018 02:42   Dg Chest Port 1 View  Result Date:  04/17/2018 CLINICAL DATA:  Altered mental status, intermittent chest pain, history CHF, hypertension, stroke EXAM: PORTABLE CHEST 1 VIEW COMPARISON:  Portable exam 2007 hours compared to 04/11/2018 FINDINGS: LEFT subclavian sequential pacemaker with leads projecting over enlarged  RIGHT atrium and RIGHT ventricle. Enlargement of cardiac silhouette. Mediastinal contours and pulmonary vascularity normal. Atherosclerotic calcification aorta. Persistent atelectasis versus consolidation of LEFT lower lobe. Improved aeration at RIGHT base since previous exam, with minimal residual RIGHT pleural effusion and basilar atelectasis. No pneumothorax. 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 IMPRESSION: Enlargement of cardiac silhouette post pacemaker. Persistent atelectasis versus consolidation LEFT lower lobe. Improved aeration RIGHT base. Electronically Signed   By: Ulyses Southward M.D.   On: 04/17/2018 20:28   Dg Chest Port 1 View  Result Date: 04/11/2018 CLINICAL DATA:  Short of breath EXAM: PORTABLE CHEST 1 VIEW COMPARISON:  04/08/2018 FINDINGS: Left-sided pacing device as before. Cardiomegaly with vascular congestion and mild pulmonary edema. Bilateral pleural effusions. Bibasilar consolidations. Moderate to large hiatal hernia. Aortic atherosclerosis. No pneumothorax. Right shoulder deformity with discontinuity at the [facility] joint, humeral head deformity, and subluxed appearance of the right humeral head. IMPRESSION: 1. Cardiomegaly with vascular congestion and pulmonary edema, not significantly changed. 2. Moderate pleural effusions and bibasilar consolidations, unchanged 3. Large hiatal hernia Electronically Signed   By: Jasmine Pang M.D.   On: 04/11/2018 14:13    Microbiology: Recent Results (from the past 240 hour(s))  Urine culture     Status: None   Collection Time: May 01, 2018  5:02 AM  Result Value Ref Range Status    Specimen Description URINE, RANDOM  Final   Special Requests NONE  Final   Culture   Final    NO GROWTH Performed at [facility], [address], [place], [place] [address]    Report Status 04/22/2018 FINAL  Final     Labs: Basic Metabolic Panel: Recent Labs  Lab 04/17/18 2141 04/18/18 0851 04/19/18 0318 04/20/18 0219 01-May-2018 0200  NA 138 140 139 136 134*  K 4.2 3.7 3.5 3.9 4.8  CL 97* 100 100 98 94*  CO2 27 25 27 22  19*  GLUCOSE 153* 152* 119* 136* 133*  BUN 38* 39* 41* 47* 55*  CREATININE 1.46* 1.46* 1.41* 1.65* 2.22*  CALCIUM 7.8* 7.7* 7.4* 7.4* 7.6*   Liver Function Tests: Recent Labs  Lab 04/17/18 2141 04/18/18 0851 May 01, 2018 0200  AST 20 19 35  ALT 9 10 13   ALKPHOS 35* 33* 40  BILITOT 1.0 0.9 2.0*  PROT 7.3 7.1 6.8  ALBUMIN 3.3* 3.3* 3.3*   No results for input(s): LIPASE, AMYLASE in the last 168 hours. Recent Labs  Lab 04/18/18 0121  AMMONIA 33   CBC: Recent Labs  Lab 04/17/18 2141 04/18/18 0851 04/19/18 0318 04/20/18 0219 May 01, 2018 0200  WBC 6.7 7.5 7.8 7.9 10.0  NEUTROABS 3.9 4.4  --   --  7.1  HGB 11.3* 11.2* 9.7* 10.1* 10.2*  HCT 37.5 38.3 31.8* 33.1* 33.8*  MCV 92.1 94.6 91.6 91.9 92.6  PLT 277 256 243 257 PLATELET CLUMPS NOTED ON SMEAR, COUNT APPEARS ADEQUATE   Cardiac Enzymes: Recent Labs  Lab 04/18/18 0851  TROPONINI 0.12*   D-Dimer No results for input(s): DDIMER in the last 72 hours. BNP: Invalid input(s): POCBNP CBG: Recent Labs  Lab 04/19/18 1954 04/20/18 0012 04/20/18 0601 04/20/18 0733 04/20/18 1106  GLUCAP 157* 125* 141* 142* 149*   Anemia work up No results for input(s): VITAMINB12, FOLATE, FERRITIN, TIBC, IRON, RETICCTPCT in the last 72 hours. Urinalysis    Component Value Date/Time   COLORURINE AMBER (A) 05/01/2018 0502   APPEARANCEUR HAZY (A) 2018-05-01 0502   LABSPEC  1.015 2018-04-24 0502   PHURINE 5.0 2018/04/24 0502   GLUCOSEU NEGATIVE 24-Apr-2018 0502   HGBUR NEGATIVE 2018-04-24 0502    BILIRUBINUR NEGATIVE 04-24-18 0502   KETONESUR NEGATIVE April 24, 2018 0502   PROTEINUR 30 (A) 04/24/18 0502   NITRITE NEGATIVE Apr 24, 2018 0502   LEUKOCYTESUR NEGATIVE 04/24/18 0502   Sepsis Labs Invalid input(s): PROCALCITONIN,  WBC,  LACTICIDVEN     SIGNED:  Lahoma Crocker, MD  [facility] 04/22/2018, 8:55 AM Pager   If 7PM-7AM, please contact night-coverage [email or web address] Password [email or web address]

## 2018-05-09 NOTE — Progress Notes (Signed)
Pharmacy Antibiotic Note  Tina Waters is a 84 y.o. female admitted on 01-May-2018 with sepsis.  Recently discharged 04/20/18 from [facility] to SNF,  SNF and EMS could not obtain BP automatically/manually. Readmitted 9/13 PM with Sepsis. She received 1 dose of vancomycin 1500 mg IV on 9/10 and aztreonam 1g IV on 04/18/18 on previous admission. Pharmacy has been consulted for Cefepime and Vancomycin dosing. Also starting on IV Flagyl.  H/o CKD stage III.  SCR on admit is 1.46, CrCl 18 ml/min.   Plan: Cefepime 2g IV x1 then 1g IV q24h  Vancomycin 1500 mg IV x1 then 1000 mg IV q48 hr. Monitor clinical status, renal function and culture results daily.  Monitor vancomycin trough at steady state.    Height: 5\' 2"  (157.5 cm)(04/18/18) Weight: 190 lb 0.6 oz (86.2 kg) IBW/kg (Calculated) : 50.1  Temp (24hrs), Avg:97.6 F (36.4 C), Min:97.4 F (36.3 C), Max:97.9 F (36.6 C)  Recent Labs  Lab 04/17/18 2141 04/18/18 0133 04/18/18 8592 04/18/18 0851 04/18/18 0903 04/19/18 0318 04/20/18 0219 2018/05/01 0200 2018/05/01 0211  WBC 6.7  --   --  7.5  --  7.8 7.9 10.0  --   CREATININE 1.46*  --   --  1.46*  --  1.41* 1.65* 2.22*  --   LATICACIDVEN  --  2.61* 2.6* 2.5* 2.58*  --   --   --  6.50*    Estimated Creatinine Clearance: 18.2 mL/min (A) (by C-G formula based on SCr of 2.22 mg/dL (H)).    Allergies  Allergen Reactions  . Penicillins Other (See Comments)    Has patient had a PCN reaction causing immediate rash, facial/tongue/throat swelling, SOB or lightheadedness with hypotension: Yes Has patient had a PCN reaction causing severe rash involving mucus membranes or skin necrosis: No Has patient had a PCN reaction that required hospitalization: No Has patient had a PCN reaction occurring within the last 10 years: No If all of the above answers are "NO", then may proceed with Cephalosporin use.   Thank you for allowing pharmacy to be a part of this patient's care. Noah Delaine, RPh Clinical  Pharmacist Please check AMION for all [facility] Pharmacy phone numbers After 10:00 PM, call Main Pharmacy [phone] 01-May-2018 3:32 AM

## 2018-05-09 NOTE — H&P (Signed)
History and Physical    Tina Waters WJX:[id number] DOB: 28-Dec-1927 DOA: 05-09-18  PCP: Eloisa Northern, MD  Patient coming from: [facility]  I have personally briefly reviewed patient's old medical records in [facility]  Chief Complaint: Somnolence and altered mental status with hypotension  HPI: Tina Waters is a 84 y.o. female with medical history significant of dementia, hypertension, stroke, chronic kidney disease stage III, severe systolic congestive heart failure New York Heart Association class V with a ejection fraction of 15%, diabetes mellitus and recent admission for pulmonary embolism who has been receiving Eliquis.  EMS was called to facility due to somnolence and altered mental status.  They had difficulty obtaining a blood pressure and initially gave IV fluids [name] of 6.  UA and chest x-ray are not suggestive of infection.  Patient has acute kidney injury.  Take and blood pressure have normalized work of breathing has improved but patient is extremely ill.  On my review of the chart the patient has an ejection fraction of 15% with pulmonary artery systolic pressure of 68 and recent bilateral pulmonary emboli.  She was doing fairly well until 2 weeks ago when she started having significant troubles per her daughters.  Daughters major concern is that mother be comfortable.  Discussed with the patient's daughters at length the patient's condition and her current trajectory.  They are requesting that mother be kept comfortable and that we consider hospice care.  In fact the daughter had been thinking about hospice prior to this event this morning.  Patient will be admitted to the palliative care floor for comfort measures and symptom management.   Review of Systems: Patient unable to give any history due to severity of medical illness  Past Medical History:  Diagnosis Date  . CHF (congestive heart failure) (HCC)   . Hypertension   . Stroke (HCC)     . Thyroid disease     History reviewed. No pertinent surgical history.  Social History   Social History Narrative  . Not on file     reports that she has never smoked. She has never used smokeless tobacco. She reports that she does not drink alcohol or use drugs.  Allergies  Allergen Reactions  . Penicillins Other (See Comments)    Has patient had a PCN reaction causing immediate rash, facial/tongue/throat swelling, SOB or lightheadedness with hypotension: Yes Has patient had a PCN reaction causing severe rash involving mucus membranes or skin necrosis: No Has patient had a PCN reaction that required hospitalization: No Has patient had a PCN reaction occurring within the last 10 years: No If all of the above answers are "NO", then may proceed with Cephalosporin use.    Family History  Problem Relation Age of Onset  . Obesity Daughter      Prior to Admission medications   Medication Sig Start Date End Date Taking? Authorizing Provider  acetaminophen (TYLENOL) 500 MG tablet Take 500 mg by mouth every 8 (eight) hours as needed (for pain).    Yes [provider]  apixaban (ELIQUIS) 5 MG TABS tablet Take 2 tablets (10 mg total) by mouth 2 (two) times daily. 04/20/18 05/20/18 Yes Arrien, York Ram, MD  atorvastatin (LIPITOR) 20 MG tablet Take 20 mg by mouth at bedtime.   Yes [provider]  carvedilol (COREG) 12.5 MG tablet Take 12.5 mg by mouth 2 (two) times daily.   Yes [provider]  cholecalciferol (VITAMIN D) 1000 units tablet Take 1,000 Units  by mouth daily.   Yes [provider]  cinacalcet (SENSIPAR) 30 MG tablet Take 30 mg by mouth 2 (two) times daily.   Yes [provider]  furosemide (LASIX) 40 MG tablet Take once daily as needed for shortness of breath or weight gain three lbs in 24 hours or five lbs in 7 days. Patient taking differently: Take 40 mg by mouth daily as needed (for shortness of breath or weight gain of 3  lbs/24 hours or 5 lbs/7 days).  04/20/18  Yes Arrien, York Ram, MD  Glycerin-Hypromellose-PEG 400 (ARTIFICIAL TEARS) 0.2-0.2-1 % SOLN Place 1 drop into both eyes every 12 (twelve) hours as needed (for dry eyes).    Yes [provider]  insulin aspart (NOVOLOG) 100 UNIT/ML injection Inject 0-10 Units into the skin See admin instructions. Inject 0-10 units into the skin three times a day before meals and at bedtime, per sliding scale: BGL 0-150 = 0 unit; 151-200 = 2 units; 201-250 = 4 units; 251-300 = 6 units; 301-350 = 8 units; 351-400 = 10 units; >400 = CALL MD   Yes [provider]  levothyroxine (SYNTHROID, LEVOTHROID) 50 MCG tablet Take 50 mcg by mouth daily before breakfast.   Yes [provider]  loperamide (IMODIUM A-D) 2 MG tablet Take 2 mg by mouth every 6 (six) hours as needed (for diarrhea).    Yes [provider]  nitroGLYCERIN (NITROSTAT) 0.4 MG SL tablet Place 0.4 mg under the tongue every 5 (five) minutes x 3 doses as needed (for chest pain and CALL MD IF NO RELIEF).    Yes [provider]  pantoprazole (PROTONIX) 40 MG tablet Take 40 mg by mouth daily.   Yes [provider]  Infant Care Products (DERMACLOUD) CREA Apply 1 application topically 3 (three) times daily. Apply to sacrum and buttocks topically every shift to protect skin from breakdown    [provider]    Physical Exam:  Constitutional: Uncomfortable, occasionally agitated with increased respiratory muscle use Vitals:   2018/05/01 0736 2018/05/01 0800 01-May-2018 0900 May 01, 2018 1000  BP: 114/72 134/70 132/66 107/78  Pulse:      Resp: (!) 23 19 (!) 21 (!) 26  Temp:      TempSrc:      SpO2: 94%     Weight:      Height:       Eyes: PERRL, lids and conjunctivae normal ENMT: Mucous membranes are moist. Posterior pharynx clear of any exudate or lesions.Normal dentition.  Neck: normal, supple, no masses, no thyromegaly Respiratory: Coarse breath sounds  bilaterally with rhonchi and rales increased respiratory effort with accessory muscle use wheeze breath sounds at bases Cardiovascular: Tachycardia rate and paced rhythm, no murmurs / rubs / gallops.  Extremity edema.  Decreased pedal pulses. No carotid bruits.  Abdomen: no tenderness, no masses palpated. No hepatosplenomegaly. Bowel sounds positive.  Musculoskeletal: no clubbing / cyanosis. No joint deformity upper and lower extremities. Good ROM, no contractures. Normal muscle tone.  Skin: no rashes, lesions, ulcers. No induration Neurologic: Occasionally awakens moves all extremities sensory is intact to light touch     Labs on Admission: I have personally reviewed following labs and imaging studies  CBC: Recent Labs  Lab 04/17/18 2141 04/18/18 0851 04/19/18 0318 04/20/18 0219 2018/05/01 0200  WBC 6.7 7.5 7.8 7.9 10.0  NEUTROABS 3.9 4.4  --   --  7.1  HGB 11.3* 11.2* 9.7* 10.1* 10.2*  HCT 37.5 38.3 31.8* 33.1* 33.8*  MCV 92.1  94.6 91.6 91.9 92.6  PLT 277 256 243 257 PLATELET CLUMPS NOTED ON SMEAR, COUNT APPEARS ADEQUATE   Basic Metabolic Panel: Recent Labs  Lab 04/14/18 1833  04/17/18 2141 04/18/18 0851 04/19/18 0318 04/20/18 0219 2018-05-06 0200  NA  --    < > 138 140 139 136 134*  K  --    < > 4.2 3.7 3.5 3.9 4.8  CL  --    < > 97* 100 100 98 94*  CO2  --    < > 27 25 27 22  19*  GLUCOSE  --    < > 153* 152* 119* 136* 133*  BUN  --    < > 38* 39* 41* 47* 55*  CREATININE  --    < > 1.46* 1.46* 1.41* 1.65* 2.22*  CALCIUM  --    < > 7.8* 7.7* 7.4* 7.4* 7.6*  MG 1.7  --   --   --   --   --   --    < > = values in this interval not displayed.   GFR: Estimated Creatinine Clearance: 18.2 mL/min (A) (by C-G formula based on SCr of 2.22 mg/dL (H)). Liver Function Tests: Recent Labs  Lab 04/17/18 2141 04/18/18 0851 05-06-18 0200  AST 20 19 35  ALT 9 10 13   ALKPHOS 35* 33* 40  BILITOT 1.0 0.9 2.0*  PROT 7.3 7.1 6.8  ALBUMIN 3.3* 3.3* 3.3*    Recent Labs  Lab  04/18/18 0121  AMMONIA 33   Cardiac Enzymes: Recent Labs  Lab 04/18/18 0851  TROPONINI 0.12*   CBG: Recent Labs  Lab 04/19/18 1954 04/20/18 0012 04/20/18 0601 04/20/18 0733 04/20/18 1106  GLUCAP 157* 125* 141* 142* 149*   Urine analysis:    Component Value Date/Time   COLORURINE AMBER (A) May 06, 2018 0502   APPEARANCEUR HAZY (A) 05-06-2018 0502   LABSPEC 1.015 May 06, 2018 0502   PHURINE 5.0 05/06/2018 0502   GLUCOSEU NEGATIVE May 06, 2018 0502   HGBUR NEGATIVE 05/06/18 0502   BILIRUBINUR NEGATIVE 05/06/2018 0502   KETONESUR NEGATIVE May 06, 2018 0502   PROTEINUR 30 (A) 2018/05/06 0502   NITRITE NEGATIVE May 06, 2018 0502   LEUKOCYTESUR NEGATIVE 05-06-2018 0502    Radiological Exams on Admission: Dg Chest Port 1 View  Result Date: 05/06/18 CLINICAL DATA:  84 y/o  F; shortness of breath. EXAM: PORTABLE CHEST 1 VIEW COMPARISON:  04/17/2018 chest radiograph.  01/10/2018 CT chest. FINDINGS: Stable cardiomegaly given projection and technique. 2 lead pacemaker. Aortic atherosclerosis with calcification. Persistent opacification of the left lung base likely related to hiatal hernia, effusion, and/or atelectasis. No pleural effusion or pneumothorax. No acute osseous abnormality is evident. IMPRESSION: 1. Stable left basilar opacity likely related to hiatal hernia, effusion, and/or atelectasis. 2. Stable cardiomegaly. 3.  Aortic Atherosclerosis (ICD10-I70.0). Electronically Signed   By: Mitzi Hansen M.D.   On: 05-06-18 02:42    EKG: Independently reviewed.  Paced rhythm  Assessment/Plan Principal Problem:   Cardiogenic shock (HCC) Active Problems:   End of life care   Pulmonary embolism (HCC)   DVT, lower extremity, proximal, acute (HCC)   Acute renal failure superimposed on stage 3 chronic kidney disease (HCC)   Acute on chronic systolic CHF (congestive heart failure), NYHA class 4 (HCC)   Insulin-requiring or dependent type II diabetes mellitus (HCC)   History  of CVA with residual deficit   Hypothyroidism  1.  Cardiogenic shock: Patient with an ejection fraction of 15% with associated severe pulmonary hypertension.  She presents with  change in mental status and inability to generate a blood pressure.  He required significant fluid resuscitation to obtain a blood pressure and is now rather congested.  Family understands that this is a untenable situation given her very poor cardiac function.  He has been very uncomfortable since beginning her illness 2 weeks ago.  Family is requesting comfort care.  Palliative care unit with morphine for shortness of breath and pain, Ativan for agitation and nausea, glycopyrrolate and scopolamine for secretions.  We will give the patient pleasure feedings.  Will consult palliative medicine for for symptom management and will add admission to hospice either at hospice house or at [facility] health care.  2.  End-of-life care: Patient clearly at the end of her life family sees this and requests comfort measures.  They have been thinking about hospice care.  She will be admitted to the palliative care unit for symptom management once symptoms are controlled we will consider transfer to either back to [facility] health care with hospice or to inpatient hospice.  3.  Pulmonary embolism: Currently on Eliquis will continue same.  4.  Lower extremity DVT: Noted continue Eliquis.  5.  Acute renal failure superimposed on stage III chronic kidney disease: This is due to extremely poor forward flow from severe heart disease.  No further treatment to offer at this point.  Will avoid IV fluids.  6.  Acute on chronic systolic congestive heart failure New York Heart Association class IV to class V: Patient with symptoms at rest unable to catch of breath unable to generate a reasonable blood pressure.  Further IV fluids.  Symptom management with morphine for shortness of breath, Ativan for agitation, and glycopyrrolate as well as scopolamine for  secretions.  7.  History of stroke with residual deficit: Noted.  8.  Hypothyroidism: Noted.     DVT prophylaxis: On Eliquis but end-of-life Code Status: DO NOT RESUSCITATE Family Communication: Spoke with patient's 2 daughters Hart Rochester and Gavin Pound who are present at the time of admission. Disposition Plan: To hospice care in 24 to 48 hours Consults called: Palliative medicine Admission status: Inpatient   Lahoma Crocker MD FACP [facility] Pager [phone]  If 7PM-7AM, please contact night-coverage [email or web address] Password [facility]  04/25/2018, 11:24 AM

## 2018-05-09 NOTE — Progress Notes (Signed)
Palliative Medicine consult noted. Due to high referral volume, there may be a delay seeing this patient. Please call the Palliative Medicine Team office at [phone] if recommendations are needed in the interim.  Thank you for inviting [place] to see this patient.  Margret Chance Rafe Mackowski, RN, BSN, [facility] Palliative Medicine Team 2018/05/09 3:05 PM Office [phone]

## 2018-05-09 NOTE — ED Notes (Signed)
Pt not alert enough to take oral medications at this time.

## 2018-05-09 NOTE — Care Management Note (Signed)
Case Management Note  Patient Details  Name: Tina Waters MRN: [id number] Date of Birth: 01-21-1928  Subjective/Objective:                    Action/Plan:  Patient from [facility] health . Will await palliative care consult Expected Discharge Date:                  Expected Discharge Plan:     In-House Referral:  Clinical Social Work  Discharge planning Services  CM Consult  Post Acute Care Choice:    Choice offered to:     DME Arranged:    DME Agency:     HH Arranged:    HH Agency:     Status of Service:  In process, will continue to follow  If discussed at Long Length of Stay Meetings, dates discussed:    Additional Comments:  Kingsley Plan, RN 05-01-2018, 1:54 PM

## 2018-05-09 DEATH — deceased

## 2019-06-23 IMAGING — CT CT HEAD W/O CM
3 of 4 series · 13 of 47 positions shown, 15 images · non-contrast
Comparison: Head CT 04/10/2018 and 04/08/2018

CLINICAL DATA: Altered mental status.

EXAM:
CT HEAD WITHOUT CONTRAST
TECHNIQUE: Contiguous axial images were obtained from the base of the skull
through the vertex without intravenous contrast.

[Series 3: head without · axial · non-contrast · 0.45mm/px · z∈[+1374,+1494]mm · 7 of 32 slices shown, 9 images]
[im 4/32  brain]
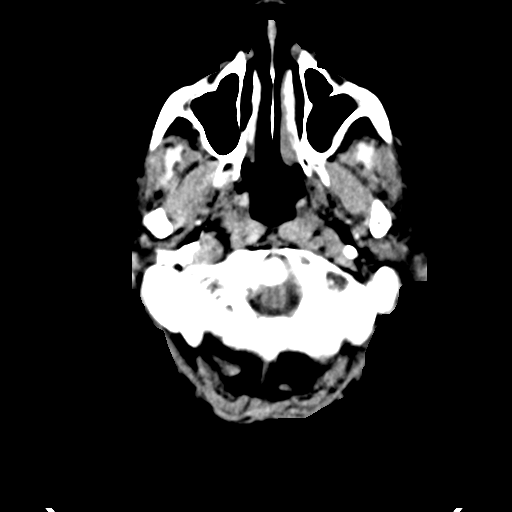
[im 4/32  bone]
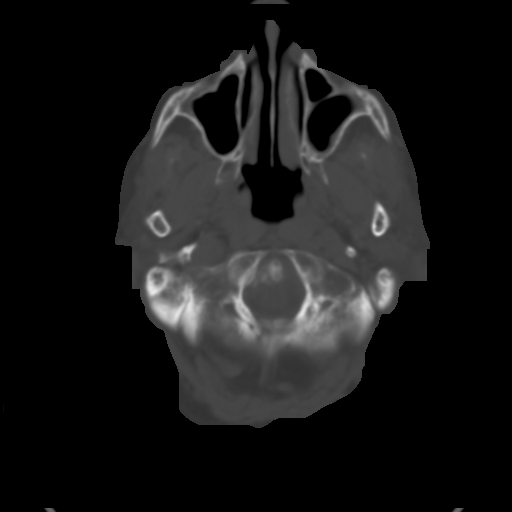
[im 8/32  brain]
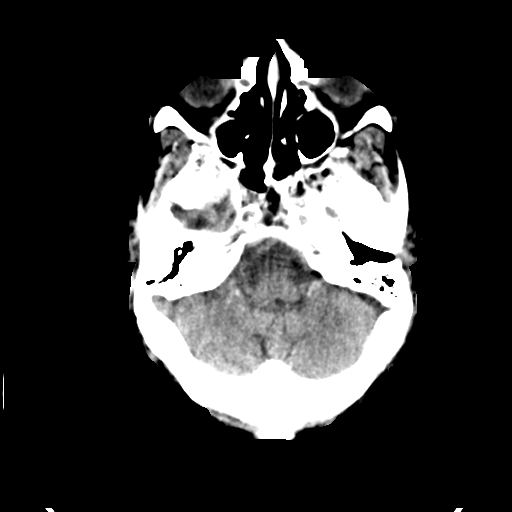
[im 12/32  brain]
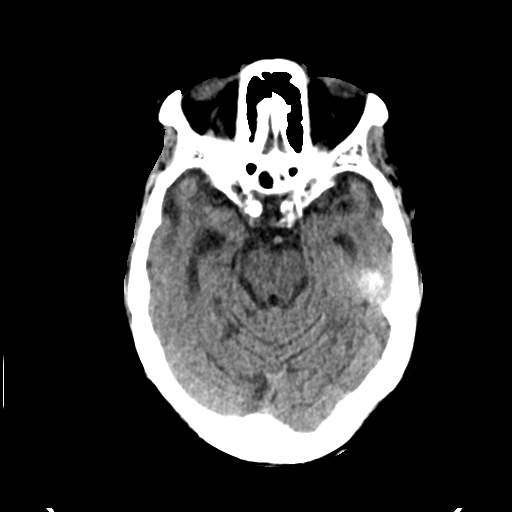
[im 16/32  brain]
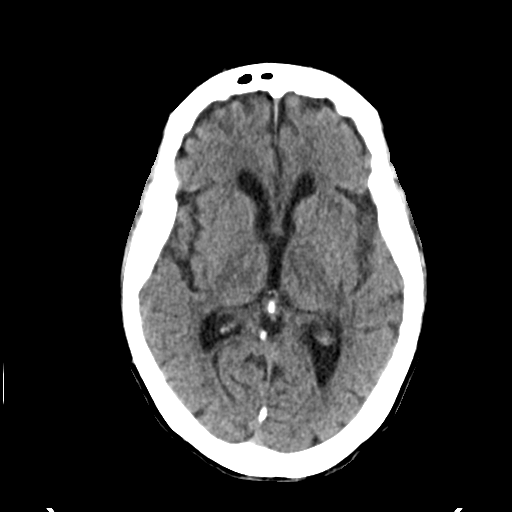
[im 20/32  brain]
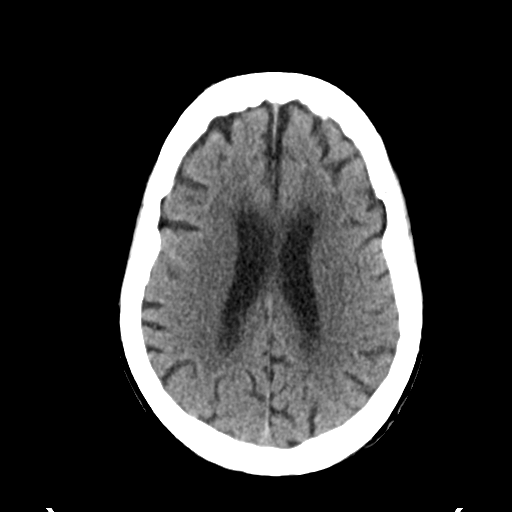
[im 20/32  bone]
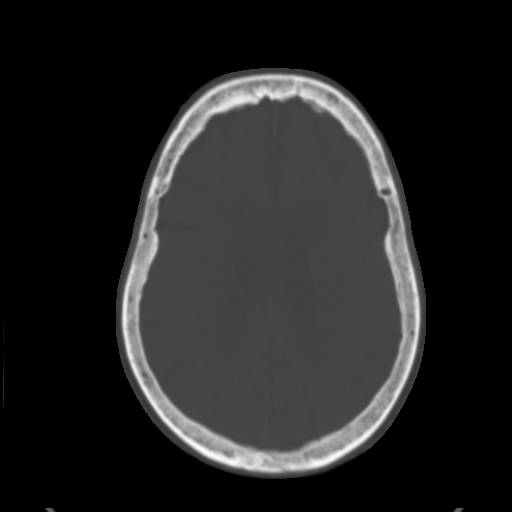
[im 24/32  brain]
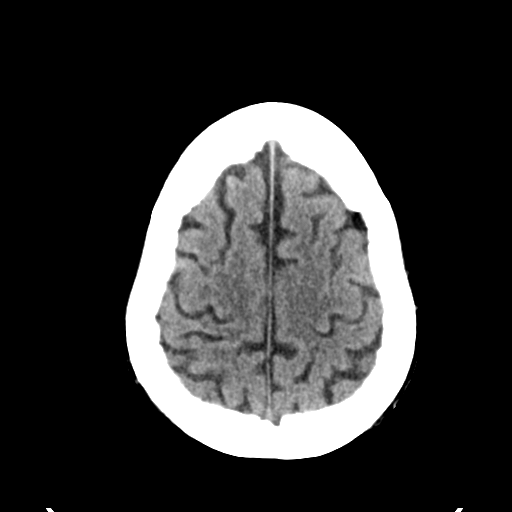
[im 28/32  brain]
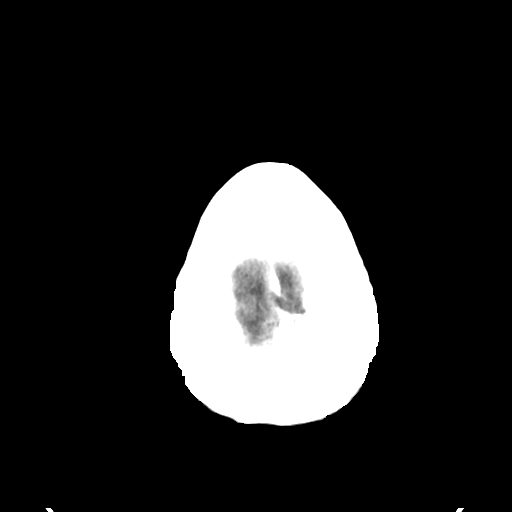

[Series 5: head without cor · coronal · non-contrast · 0.31mm/px · 3 of 67 slices shown]
[im 23/67  brain]
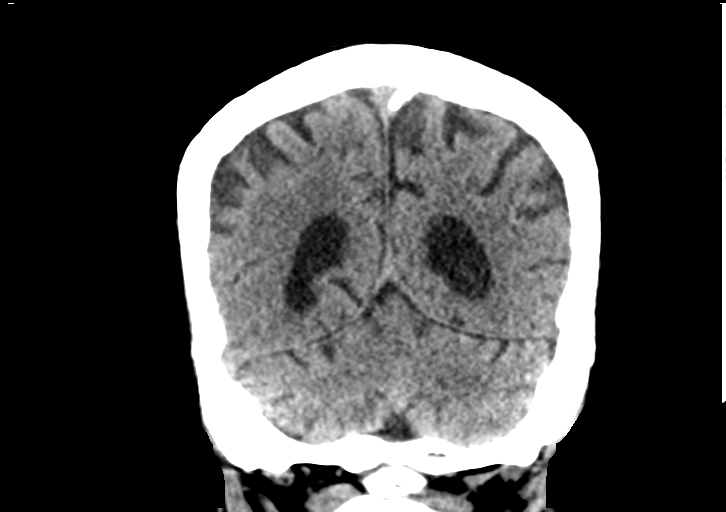
[im 30/67  brain]
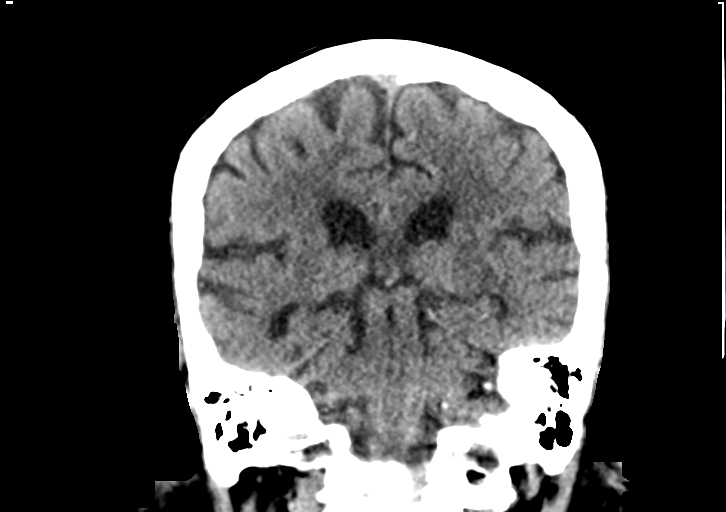
[im 37/67  brain]
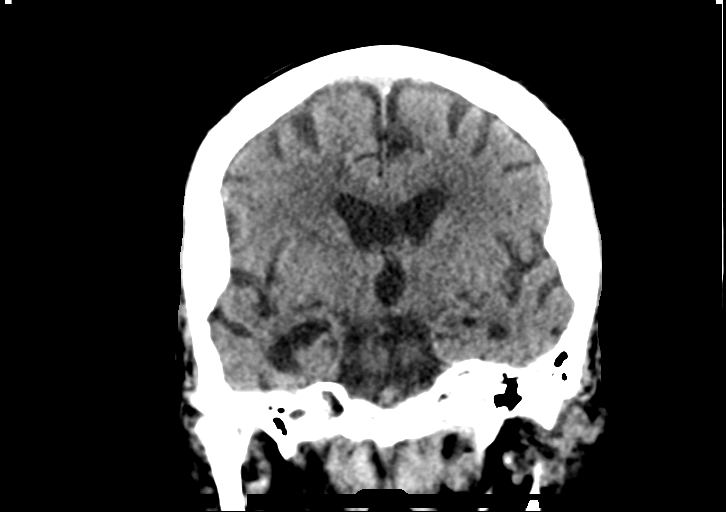

[Series 6: head without sag · sagittal · non-contrast · 0.31mm/px · 3 of 65 slices shown]
[im 22/65  brain]
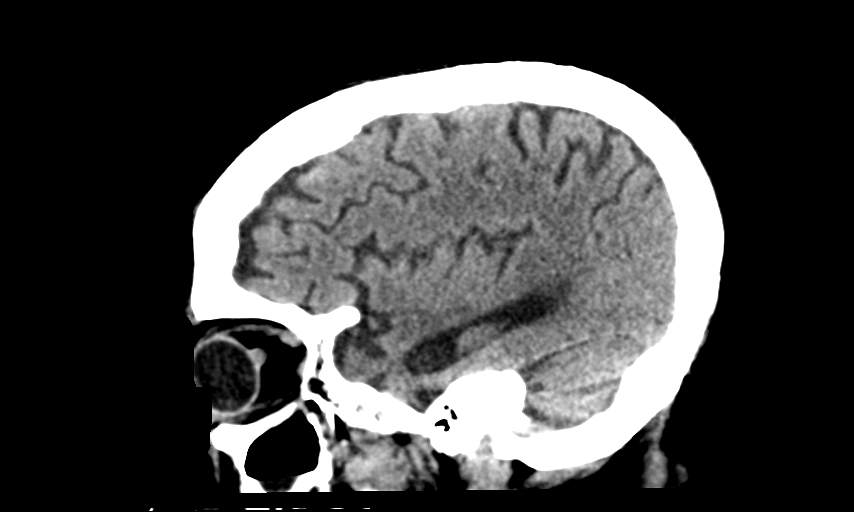
[im 33/65  brain]
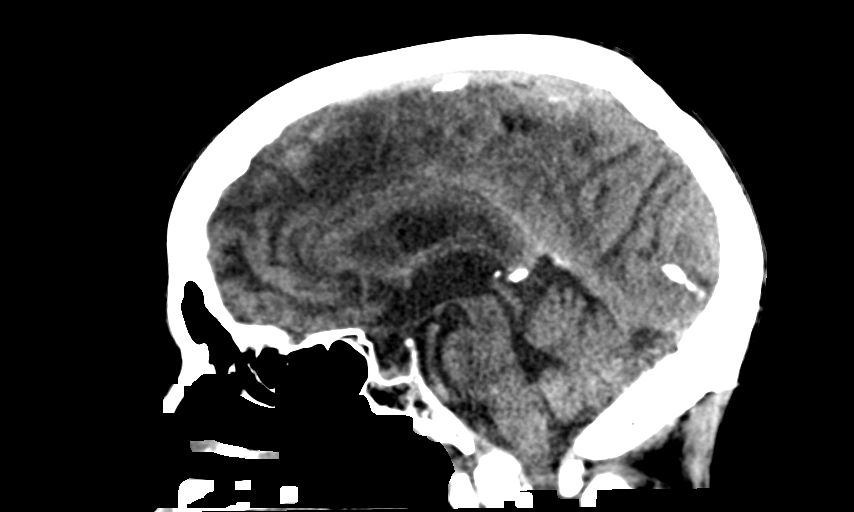
[im 43/65  brain]
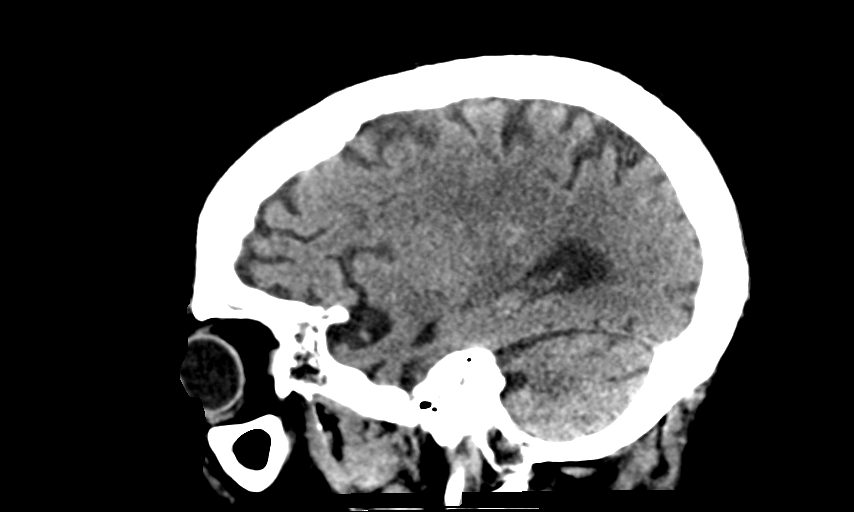

[13 of 47 positions shown; findings below may reference images not displayed]

FINDINGS: Brain: Unchanged left basal ganglia/subinsular subacute or chronic
lacunar infarct from prior exam. No hemorrhagic transformation. No
evidence of new ischemia or hemorrhage elsewhere. No subdural or
extra-axial fluid collection. Unchanged degree of atrophy and
chronic small vessel ischemia. No hydrocephalus.

Vascular: No hyperdense vessel. Atherosclerosis of skull base
vasculature.

Skull: No focal lesion or fracture. Diffuse calvarial heterogeneity
likely osteopenia/osteoporosis.

Sinuses/Orbits: Prior cataract resection.  No acute findings.

Other: None.
IMPRESSION: 1.  No acute intracranial abnormality.
2. Unchanged subacute or chronic small left basal ganglia/subinsular
lacunar infarct since prior exam. Unchanged atrophy and chronic
small vessel ischemia.
